# Patient Record
Sex: Male | Born: 1960
Health system: Southern US, Community
[De-identification: ages and names within clinical notes are randomized; demographics above are authoritative.]

## PROBLEM LIST (undated history)

## (undated) DIAGNOSIS — G44009 Cluster headache syndrome, unspecified, not intractable: Secondary | ICD-10-CM

## (undated) DIAGNOSIS — I10 Essential (primary) hypertension: Secondary | ICD-10-CM

## (undated) HISTORY — PX: CARPAL TUNNEL RELEASE: SHX101

## (undated) HISTORY — DX: Cluster headache syndrome, unspecified, not intractable: G44.009

## (undated) HISTORY — PX: COLONOSCOPY: SHX174

## (undated) HISTORY — DX: Essential (primary) hypertension: I10

---

## 2006-03-05 ENCOUNTER — Ambulatory Visit (HOSPITAL_COMMUNITY): Admission: RE | Admit: 2006-03-05 | Discharge: 2006-03-05 | Payer: Self-pay | Admitting: Unknown Physician Specialty

## 2010-11-01 ENCOUNTER — Ambulatory Visit (INDEPENDENT_AMBULATORY_CARE_PROVIDER_SITE_OTHER): Payer: BC Managed Care – PPO | Admitting: Cardiovascular Disease

## 2010-11-01 DIAGNOSIS — R9431 Abnormal electrocardiogram [ECG] [EKG]: Secondary | ICD-10-CM

## 2010-11-01 DIAGNOSIS — E7800 Pure hypercholesterolemia, unspecified: Secondary | ICD-10-CM

## 2010-11-01 DIAGNOSIS — F17200 Nicotine dependence, unspecified, uncomplicated: Secondary | ICD-10-CM

## 2010-11-01 DIAGNOSIS — I10 Essential (primary) hypertension: Secondary | ICD-10-CM

## 2010-11-08 ENCOUNTER — Telehealth (INDEPENDENT_AMBULATORY_CARE_PROVIDER_SITE_OTHER): Payer: Self-pay | Admitting: *Deleted

## 2010-11-13 NOTE — Progress Notes (Signed)
----   Converted from flag ---- ---- 11/07/2010 3:30 PM, Marilynne Halsted, CMA, AAMA wrote: Victorino Dike,  For Stress echo 11-09-10@[facility], pt has BCBS of [place], Per Marion T, [id number], no precert required.  [place],  Can you correct pt's policy#?  Per Merdis Delay T, it is supposed to be: [id number]  thanks,  Charmaine ------------------------------

## 2010-11-15 ENCOUNTER — Telehealth: Payer: Self-pay | Admitting: Cardiovascular Disease

## 2010-11-22 NOTE — Progress Notes (Signed)
Summary: stress test results  Phone Note Outgoing Call   Call placed by: Dessie Coma  LPN,  November 15, 2010 5:46 PM Call placed to: Patient Summary of Call: Spoke to daughter in patient's absence-informed her that stress test was normal per Dr. Kirke Corin.

## 2010-12-07 NOTE — Letter (Signed)
November 01, 2010   Dr. Brent Bulla [address] [place], [place] [address]  RE:  Jeff, Acevedo MRN:  [id number]  /  DOB:  [birth year]  Dear Dr. Marina Goodell,  Thank you for referring [name] for further cardiac evaluation.  As you are aware, this is a pleasant 50 year old gentleman with the following problem list: 1. Prolonged history of hypertension which was diagnosed more than 20     years ago. 2. Hypertension. 3. History of cluster headache.  HISTORY OF PRESENT ILLNESS:  [name] is here today for evaluation of abnormal ECG.  He is not aware of any previous cardiac history. However, he is aware that his EKG has been abnormal for awhile.  He denies any chest pain, dyspnea, palpitations, presyncope or syncope. There is no orthopnea, PND or lower extremity edema.  Overall, he does not seem to have cardiac symptoms.  He is very active at work.  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  He is usually not symptomatic with any of these activities.  According to his wife, he does have problems at night when he is asleep.  She notices episodes of apnea followed by loud noise.  During the day, when he is home, he is usually tired and takes naps.  He has not been evaluated for sleep apnea.  MEDICATIONS: 1. Hydrochlorothiazide 25 mg half a tablet once daily. 2. Lisinopril 40 mg once daily. 3. Crestor 40 mg half a tablet at bedtime. 4. Verapamil sustained release 360 mg once daily.  ALLERGIES:  NO KNOWN DRUG ALLERGIES.  SOCIAL HISTORY:  This is remarkable for smoking one pack per day for 21 years.  He denies any alcohol or recreational drug use.  He is married and has two children.  PAST SURGICAL HISTORY:  Carpal tunnel surgery.  FAMILY HISTORY:  Father died at age of congestive heart failure.  He had coronary artery disease in his early 39s.  There is a strong family history of hypertension.  REVIEW OF SYSTEMS:  Remarkable for occasional  heartburn.  A full review of system was performed and is otherwise negative.  PHYSICAL EXAMINATION:  GENERAL:  The patient appears to be his stated age and in no acute distress. VITAL SIGNS:  Weight is 186.4 pounds, blood pressure is 139/89, pulse is 77, oxygen saturation is 97% on room air. HEENT:  Normocephalic, atraumatic. NECK:  No JVD or carotid bruits. RESPIRATORY:  Normal respiratory effort with no use of accessory muscles.  Auscultation reveals normal breath sounds. CARDIOVASCULAR:  Normal PMI.  Normal S1-S2 with no gallops or murmurs. ABDOMEN:  Benign, nontender, nondistended. EXTREMITIES:  With no clubbing, cyanosis or edema. SKIN:  Warm and dry with no rash. PSYCHIATRIC:  He is alert, oriented x3 with normal mood and affect. MUSCULOSKELETAL:  There is normal muscle strength in the upper and lower extremities.  STUDIES:  An electrocardiogram was performed which showed normal sinus rhythm.  There is borderline criteria for left ventricular hypertrophy. There are T-wave changes mostly in the anterolateral and lateral leads suggestive of ischemia.  IMPRESSION: 1. Abnormal ECG which could be due to hypertensive heart disease.     There are also some possible ischemic changes.  The patient does     not have any symptoms suggestive of angina, heart failure or     arrhythmia.  However, the EKG is significantly abnormal and the     patient has multiple risk factors for coronary artery disease.  Thus, I recommend proceeding with a stress echocardiogram for     further evaluation.  This will give [place] an idea about the structure     of his heart as well as ruling out an underlying obstructive     coronary artery disease.  In the meanwhile, I recommend continuing     treatment of his a hypertension.  He would probably benefit from     aspirin 81 mg once daily which was discussed with him.  Continued     aggressive lifestyle changes are recommended. 2. Hypertension.  This is  likely essential hypertension, and he had     this for a long time, likely due to his family history.  I will     continue with current medications.  He does have symptoms that are     strongly suggestive of sleep apnea.  I discussed with him that this     will have long-term effect on his blood pressure as well as his     cardiac status.  Thus, I recommend that he gets evaluation and     treatment for possible sleep apnea.  He will discuss this with Dr.     Marina Goodell upon follow-up. 3. Tobacco use.  The patient was counseled about the importance of     smoking cessation.  He does not seem to be ready mentally yet to     commit. 4. Hyperlipidemia.  Being treated with Crestor.  Goal LDL should be     less than 100.  The patient will be notified with the results of     the stress test.  He will follow-up if it is abnormal.  Thank you for allowing me to participate in the care of your patient.   Sincerely,     Lorine Bears, MD Electronically Signed   MA/MedQ  DD: 11/01/2010  DT: 11/01/2010  Job #: [id number]

## 2011-05-09 ENCOUNTER — Encounter: Payer: Self-pay | Admitting: Cardiovascular Disease

## 2011-05-30 ENCOUNTER — Encounter: Payer: Self-pay | Admitting: Cardiovascular Disease

## 2016-01-01 ENCOUNTER — Encounter (HOSPITAL_COMMUNITY): Payer: Self-pay | Admitting: Emergency Medicine

## 2016-01-01 ENCOUNTER — Emergency Department (HOSPITAL_COMMUNITY)
Admission: EM | Admit: 2016-01-01 | Discharge: 2016-01-02 | Disposition: A | Discharge status: Home / Self Care | Payer: BLUE CROSS/BLUE SHIELD | Source: Home / Self Care | Attending: Emergency Medicine | Admitting: Emergency Medicine

## 2016-01-01 DIAGNOSIS — Z8669 Personal history of other diseases of the nervous system and sense organs: Secondary | ICD-10-CM | POA: Insufficient documentation

## 2016-01-01 DIAGNOSIS — R630 Anorexia: Secondary | ICD-10-CM | POA: Insufficient documentation

## 2016-01-01 DIAGNOSIS — R509 Fever, unspecified: Secondary | ICD-10-CM | POA: Insufficient documentation

## 2016-01-01 DIAGNOSIS — R634 Abnormal weight loss: Secondary | ICD-10-CM | POA: Insufficient documentation

## 2016-01-01 DIAGNOSIS — I10 Essential (primary) hypertension: Secondary | ICD-10-CM | POA: Insufficient documentation

## 2016-01-01 DIAGNOSIS — R05 Cough: Secondary | ICD-10-CM | POA: Insufficient documentation

## 2016-01-01 DIAGNOSIS — Z79899 Other long term (current) drug therapy: Secondary | ICD-10-CM | POA: Insufficient documentation

## 2016-01-01 DIAGNOSIS — R51 Headache: Secondary | ICD-10-CM

## 2016-01-01 DIAGNOSIS — A879 Viral meningitis, unspecified: Secondary | ICD-10-CM | POA: Diagnosis not present

## 2016-01-01 DIAGNOSIS — R197 Diarrhea, unspecified: Secondary | ICD-10-CM

## 2016-01-01 DIAGNOSIS — R112 Nausea with vomiting, unspecified: Secondary | ICD-10-CM | POA: Insufficient documentation

## 2016-01-01 LAB — COMPREHENSIVE METABOLIC PANEL
ALBUMIN: 3 g/dL — AB (ref 3.5–5.0)
ALT: 14 U/L — AB (ref 17–63)
AST: 23 U/L (ref 15–41)
Alkaline Phosphatase: 63 U/L (ref 38–126)
Anion gap: 8 (ref 5–15)
BUN: 21 mg/dL — AB (ref 6–20)
CHLORIDE: 100 mmol/L — AB (ref 101–111)
CO2: 26 mmol/L (ref 22–32)
CREATININE: 0.87 mg/dL (ref 0.61–1.24)
Calcium: 8.2 mg/dL — ABNORMAL LOW (ref 8.9–10.3)
GFR calc Af Amer: >60 mL/min (ref >60)
Glucose, Bld: 119 mg/dL — ABNORMAL HIGH (ref 65–99)
POTASSIUM: 3.6 mmol/L (ref 3.5–5.1)
SODIUM: 134 mmol/L — AB (ref 135–145)
Total Bilirubin: 0.3 mg/dL (ref 0.3–1.2)
Total Protein: 6.8 g/dL (ref 6.5–8.1)

## 2016-01-01 LAB — CBC
HEMATOCRIT: 40.8 % (ref 39.0–52.0)
Hemoglobin: 14 g/dL (ref 13.0–17.0)
MCH: 29.5 pg (ref 26.0–34.0)
MCHC: 34.3 g/dL (ref 30.0–36.0)
MCV: 85.9 fL (ref 78.0–100.0)
Platelets: 223 10*3/uL (ref 150–400)
RBC: 4.75 MIL/uL (ref 4.22–5.81)
RDW: 12.9 % (ref 11.5–15.5)
WBC: 8.4 10*3/uL (ref 4.0–10.5)

## 2016-01-01 LAB — LIPASE, BLOOD: LIPASE: 21 U/L (ref 11–51)

## 2016-01-01 NOTE — ED Notes (Signed)
Pt states that he has had fever, headache, and emesis x 1 week. Tested for flu twice but negative. Alert and oriented.

## 2016-01-02 ENCOUNTER — Emergency Department (HOSPITAL_COMMUNITY): Payer: BLUE CROSS/BLUE SHIELD

## 2016-01-02 LAB — DIFFERENTIAL
BASOS ABS: 0 10*3/uL (ref 0.0–0.1)
BASOS PCT: 0 %
EOS ABS: 0 10*3/uL (ref 0.0–0.7)
EOS PCT: 0 %
Lymphocytes Relative: 17 %
Lymphs Abs: 1.4 10*3/uL (ref 0.7–4.0)
Monocytes Absolute: 0.6 10*3/uL (ref 0.1–1.0)
Monocytes Relative: 8 %
NEUTROS PCT: 75 %
Neutro Abs: 6.4 10*3/uL (ref 1.7–7.7)

## 2016-01-02 LAB — URINALYSIS, ROUTINE W REFLEX MICROSCOPIC
BILIRUBIN URINE: NEGATIVE
Glucose, UA: NEGATIVE mg/dL
Hgb urine dipstick: NEGATIVE
KETONES UR: NEGATIVE mg/dL
LEUKOCYTES UA: NEGATIVE
NITRITE: NEGATIVE
PH: 6 (ref 5.0–8.0)
PROTEIN: NEGATIVE mg/dL
SPECIFIC GRAVITY, URINE: 1.025 (ref 1.005–1.030)

## 2016-01-02 MED ORDER — SODIUM CHLORIDE 0.9 % IV SOLN
1000.0000 mL | Freq: Once | INTRAVENOUS | Status: AC
  Administered 2016-01-02: 1000 mL via INTRAVENOUS

## 2016-01-02 MED ORDER — DOXYCYCLINE HYCLATE 100 MG PO CAPS: 100.0000 mg | ORAL_CAPSULE | Freq: Two times a day (BID) | ORAL | Status: DC

## 2016-01-02 MED ORDER — SODIUM CHLORIDE 0.9 % IV SOLN
1000.0000 mL | INTRAVENOUS | Status: DC
  Administered 2016-01-02: 1000 mL via INTRAVENOUS

## 2016-01-02 MED ORDER — DOXYCYCLINE HYCLATE 100 MG PO TABS
100.0000 mg | ORAL_TABLET | Freq: Once | ORAL | Status: AC
  Administered 2016-01-02: 100 mg via ORAL
  Filled 2016-01-02: qty 1

## 2016-01-02 NOTE — Discharge Instructions (Signed)
Evaluation so far has not shown the reason for your fever. Please keep your follow-up appointment with your primary care provider. Return to the emergency department if symptoms worsen.   Fever, Adult A fever is an increase in the body's temperature. It is usually defined as a temperature of 100F (38C) or higher. Brief mild or moderate fevers generally have no long-term effects, and they often do not require treatment. Moderate or high fevers may make you feel uncomfortable and can sometimes be a sign of a serious illness or disease. The sweating that may occur with repeated or prolonged fever may also cause dehydration. Fever is confirmed by taking a temperature with a thermometer. A measured temperature can vary with:  Age.  Time of day.  Location of the thermometer:  Mouth (oral).  Rectum (rectal).  Ear (tympanic).  Underarm (axillary).  Forehead (temporal). HOME CARE INSTRUCTIONS Pay attention to any changes in your symptoms. Take these actions to help with your condition:  Take over-the counter and prescription medicines only as told by your health care provider. Follow the dosing instructions carefully.  If you were prescribed an antibiotic medicine, take it as told by your health care provider. Do not stop taking the antibiotic even if you start to feel better.  Rest as needed.  Drink enough fluid to keep your urine clear or pale yellow. This helps to prevent dehydration.  Sponge yourself or bathe with room-temperature water to help reduce your body temperature as needed. Do not use ice water.  Do not overbundle yourself in blankets or heavy clothes. SEEK MEDICAL CARE IF:  You vomit.  You cannot eat or drink without vomiting.  You have diarrhea.  You have pain when you urinate.  Your symptoms do not improve with treatment.  You develop new symptoms.  You develop excessive weakness. SEEK IMMEDIATE MEDICAL CARE IF:  You have shortness of breath or have  trouble breathing.  You are dizzy or you faint.  You are disoriented or confused.  You develop signs of dehydration, such as a dry mouth, decreased urination, or paleness.  You develop severe pain in your abdomen.  You have persistent vomiting or diarrhea.  You develop a skin rash.  Your symptoms suddenly get worse.   This information is not intended to replace advice given to you by your health care provider. Make sure you discuss any questions you have with your health care provider.   Document Released: 02/26/2001 Document Revised: 05/24/2015 Document Reviewed: 10/27/2014 Elsevier Interactive Patient Education 2016 [employer].  Doxycycline tablets or capsules What is this medicine? DOXYCYCLINE (dox i SYE kleen) is a tetracycline antibiotic. It kills certain bacteria or stops their growth. It is used to treat many kinds of infections, like dental, skin, respiratory, and urinary tract infections. It also treats acne, Lyme disease, malaria, and certain sexually transmitted infections. This medicine may be used for other purposes; ask your health care provider or pharmacist if you have questions. What should I tell my health care provider before I take this medicine? They need to know if you have any of these conditions: -liver disease -long exposure to sunlight like working outdoors -stomach problems like colitis -an unusual or allergic reaction to doxycycline, tetracycline antibiotics, other medicines, foods, dyes, or preservatives -pregnant or trying to get pregnant -breast-feeding How should I use this medicine? Take this medicine by mouth with a full glass of water. Follow the directions on the prescription label. It is best to take this medicine without food, but  if it upsets your stomach take it with food. Take your medicine at regular intervals. Do not take your medicine more often than directed. Take all of your medicine as directed even if you think you are better. Do  not skip doses or stop your medicine early. Talk to your pediatrician regarding the use of this medicine in children. While this drug may be prescribed for selected conditions, precautions do apply. Overdosage: If you think you have taken too much of this medicine contact a poison control center or emergency room at once. NOTE: This medicine is only for you. Do not share this medicine with others. What if I miss a dose? If you miss a dose, take it as soon as you can. If it is almost time for your next dose, take only that dose. Do not take double or extra doses. What may interact with this medicine? -antacids -barbiturates -birth control pills -bismuth subsalicylate -carbamazepine -methoxyflurane -other antibiotics -phenytoin -vitamins that contain iron -warfarin This list may not describe all possible interactions. Give your health care provider a list of all the medicines, herbs, non-prescription drugs, or dietary supplements you use. Also tell them if you smoke, drink alcohol, or use illegal drugs. Some items may interact with your medicine. What should I watch for while using this medicine? Tell your doctor or health care professional if your symptoms do not improve. Do not treat diarrhea with over the counter products. Contact your doctor if you have diarrhea that lasts more than 2 days or if it is severe and watery. Do not take this medicine just before going to bed. It may not dissolve properly when you lay down and can cause pain in your throat. Drink plenty of fluids while taking this medicine to also help reduce irritation in your throat. This medicine can make you more sensitive to the sun. Keep out of the sun. If you cannot avoid being in the sun, wear protective clothing and use sunscreen. Do not use sun lamps or tanning beds/booths. Birth control pills may not work properly while you are taking this medicine. Talk to your doctor about using an extra method of birth control. If  you are being treated for a sexually transmitted infection, avoid sexual contact until you have finished your treatment. Your sexual partner may also need treatment. Avoid antacids, aluminum, calcium, magnesium, and iron products for 4 hours before and 2 hours after taking a dose of this medicine. If you are using this medicine to prevent malaria, you should still protect yourself from contact with mosquitos. Stay in screened-in areas, use mosquito nets, keep your body covered, and use an insect repellent. What side effects may I notice from receiving this medicine? Side effects that you should report to your doctor or health care professional as soon as possible: -allergic reactions like skin rash, itching or hives, swelling of the face, lips, or tongue -difficulty breathing -fever -itching in the rectal or genital area -pain on swallowing -redness, blistering, peeling or loosening of the skin, including inside the mouth -severe stomach pain or cramps -unusual bleeding or bruising -unusually weak or tired -yellowing of the eyes or skin Side effects that usually do not require medical attention (report to your doctor or health care professional if they continue or are bothersome): -diarrhea -loss of appetite -nausea, vomiting This list may not describe all possible side effects. Call your doctor for medical advice about side effects. You may report side effects to FDA at 1-800-FDA-1088. Where should I keep my  medicine? Keep out of the reach of children. Store at room temperature, below 30 degrees C (86 degrees F). Protect from light. Keep container tightly closed. Throw away any unused medicine after the expiration date. Taking this medicine after the expiration date can make you seriously ill. NOTE: This sheet is a summary. It may not cover all possible information. If you have questions about this medicine, talk to your doctor, pharmacist, or health care provider.    2016, Elsevier/Gold  Standard. (2014-12-23 12:10:28)

## 2016-01-02 NOTE — ED Provider Notes (Signed)
CSN: [id number]     Arrival date & time 01/01/16  1926 History  By signing my name below, I, Tanda Rockers, attest that this documentation has been prepared under the direction and in the presence of Dione Booze, MD. Electronically Signed: Tanda Rockers, ED Scribe. 01/02/2016. 12:12 AM.   Chief Complaint  Patient presents with  . Fever  . Emesis   The history is provided by the patient. No language interpreter was used.     HPI Comments: Jeff Acevedo is a 55 y.o. male with PMHx HTN, who presents to the Emergency Department complaining of fever with Tmax 104 x 1 week. Pt's temperature in the ED is 98.6. Pt also complains of headaches, chills, loss of appetite, nausea, vomiting, diarrhea, and dry cough. Family reports that pt has lost 10 pounds in the past 2 weeks. He was seen at [facility] earlier today and was tested for flu which was negative. Pt is unsure if he has had recent sick contact. No recent tick exposure. Denies rhinorrhea, sore throat, rash, or any other associated symptoms.  Past Medical History  Diagnosis Date  . Hypotension   . HTN (hypertension)   . Cluster headache    Past Surgical History  Procedure Laterality Date  . Carpal tunnel release     Family History  Problem Relation Age of Onset  . Heart failure     Social History  Substance Use Topics  . Smoking status: Current Every Day Smoker -- 1.00 packs/day for 21 years  . Smokeless tobacco: None  . Alcohol Use: No    Review of Systems  Constitutional: Positive for fever, chills, appetite change and unexpected weight change.  HENT: Negative for rhinorrhea and sore throat.   Respiratory: Positive for cough.   Gastrointestinal: Positive for nausea, vomiting and diarrhea.  Skin: Negative for rash.  Neurological: Positive for headaches.  All other systems reviewed and are negative.  Allergies  Review of patient's allergies indicates no known allergies.  Home Medications   Prior to Admission medications    Medication Sig Start Date End Date Taking? Authorizing Provider  hydrochlorothiazide 25 MG tablet Take 25 mg by mouth daily.     Yes Historical Provider, MD  lisinopril (PRINIVIL,ZESTRIL) 40 MG tablet Take 40 mg by mouth daily.     Yes Historical Provider, MD  rosuvastatin (CRESTOR) 40 MG tablet Take 40 mg by mouth daily.    Yes Historical Provider, MD  verapamil (CALAN-SR) 180 MG CR tablet Take 360 mg by mouth at bedtime.   Yes Historical Provider, MD   BP 146/89 mmHg  Pulse 62  Temp(Src) 98.6 F (37 C) (Oral)  Resp 18  SpO2 100%   Physical Exam  Constitutional: He is oriented to person, place, and time. He appears well-developed and well-nourished. No distress.  HENT:  Head: Normocephalic and atraumatic.  Eyes: Conjunctivae and EOM are normal. Pupils are equal, round, and reactive to light.  Neck: Normal range of motion. Neck supple. No JVD present.  Cardiovascular: Normal rate, regular rhythm and normal heart sounds.   Pulmonary/Chest: Effort normal and breath sounds normal. He has no wheezes. He has no rales. He exhibits no tenderness.  Abdominal: Soft. Bowel sounds are normal. He exhibits no distension and no mass. There is no tenderness.  Musculoskeletal: Normal range of motion. He exhibits no edema.  Lymphadenopathy:    He has no cervical adenopathy.  Neurological: He is alert and oriented to person, place, and time. No cranial nerve deficit. He exhibits  normal muscle tone. Coordination normal.  Skin: Skin is warm and dry. No rash noted.  Psychiatric: He has a normal mood and affect. His behavior is normal. Judgment and thought content normal.  Nursing note and vitals reviewed.   ED Course  Procedures (including critical care time)  DIAGNOSTIC STUDIES: Oxygen Saturation is 100% on RA, normal by my interpretation.    COORDINATION OF CARE: 12:10 AM-Discussed treatment plan which includes IV Fluids, UA, and CXR with pt at bedside and pt agreed to plan.   Labs  Review Results for orders placed or performed during the hospital encounter of 01/01/16  Lipase, blood  Result Value Ref Range   Lipase 21 11 - 51 U/L  Comprehensive metabolic panel  Result Value Ref Range   Sodium 134 (L) 135 - 145 mmol/L   Potassium 3.6 3.5 - 5.1 mmol/L   Chloride 100 (L) 101 - 111 mmol/L   CO2 26 22 - 32 mmol/L   Glucose, Bld 119 (H) 65 - 99 mg/dL   BUN 21 (H) 6 - 20 mg/dL   Creatinine, Ser 1.61 0.61 - 1.24 mg/dL   Calcium 8.2 (L) 8.9 - 10.3 mg/dL   Total Protein 6.8 6.5 - 8.1 g/dL   Albumin 3.0 (L) 3.5 - 5.0 g/dL   AST 23 15 - 41 U/L   ALT 14 (L) 17 - 63 U/L   Alkaline Phosphatase 63 38 - 126 U/L   Total Bilirubin 0.3 0.3 - 1.2 mg/dL   GFR calc non Af Amer >60 >60 mL/min   GFR calc Af Amer >60 >60 mL/min   Anion gap 8 5 - 15  CBC  Result Value Ref Range   WBC 8.4 4.0 - 10.5 K/uL   RBC 4.75 4.22 - 5.81 MIL/uL   Hemoglobin 14.0 13.0 - 17.0 g/dL   HCT 09.6 04.5 - 40.9 %   MCV 85.9 78.0 - 100.0 fL   MCH 29.5 26.0 - 34.0 pg   MCHC 34.3 30.0 - 36.0 g/dL   RDW 81.1 91.4 - 78.2 %   Platelets 223 150 - 400 K/uL  Urinalysis, Routine w reflex microscopic (not at [facility])  Result Value Ref Range   Color, Urine YELLOW YELLOW   APPearance CLEAR CLEAR   Specific Gravity, Urine 1.025 1.005 - 1.030   pH 6.0 5.0 - 8.0   Glucose, UA NEGATIVE NEGATIVE mg/dL   Hgb urine dipstick NEGATIVE NEGATIVE   Bilirubin Urine NEGATIVE NEGATIVE   Ketones, ur NEGATIVE NEGATIVE mg/dL   Protein, ur NEGATIVE NEGATIVE mg/dL   Nitrite NEGATIVE NEGATIVE   Leukocytes, UA NEGATIVE NEGATIVE  Differential  Result Value Ref Range   Neutrophils Relative % 75 %   Neutro Abs 6.4 1.7 - 7.7 K/uL   Lymphocytes Relative 17 %   Lymphs Abs 1.4 0.7 - 4.0 K/uL   Monocytes Relative 8 %   Monocytes Absolute 0.6 0.1 - 1.0 K/uL   Eosinophils Relative 0 %   Eosinophils Absolute 0.0 0.0 - 0.7 K/uL   Basophils Relative 0 %   Basophils Absolute 0.0 0.0 - 0.1 K/uL   Dg Chest 2 View  01/02/2016   CLINICAL DATA:  Fever, headache, and vomiting for 1 week. History of hypertension. Current smoker. EXAM: CHEST  2 VIEW COMPARISON:  None. FINDINGS: Normal heart size and pulmonary vascularity. No focal airspace disease or consolidation in the lungs. No blunting of costophrenic angles. No pneumothorax. Mediastinal contours appear intact. Degenerative changes in the spine. Calcification of the aorta. IMPRESSION: No  active cardiopulmonary disease. Electronically Signed   By: Burman NievesWilliam  Stevens M.D.   On: 01/02/2016 00:36     Imaging Review Dg Chest 2 View  01/02/2016  CLINICAL DATA:  Fever, headache, and vomiting for 1 week. History of hypertension. Current smoker. EXAM: CHEST  2 VIEW COMPARISON:  None. FINDINGS: Normal heart size and pulmonary vascularity. No focal airspace disease or consolidation in the lungs. No blunting of costophrenic angles. No pneumothorax. Mediastinal contours appear intact. Degenerative changes in the spine. Calcification of the aorta. IMPRESSION: No active cardiopulmonary disease. Electronically Signed   By: Burman NievesWilliam  Stevens M.D.   On: 01/02/2016 00:36   I have personally reviewed and evaluated these images and lab results as part of my medical decision-making.    MDM   Final diagnoses:  Fever, unspecified fever cause    Fever of uncertain cause. Exam is unremarkable and history is unremarkable. He has had negative flu test. In the ED, chest x-ray shows no evidence of pneumonia, urinalysis is normal, WBC is normal. He was given IV fluids with some subjective improvement. Decision is made to treat for possible occult tick borne illness such as [facility] spotted fever or line disease. He is discharged with prescription for doxycycline and is referred back to his PCP for follow-up.   I personally performed the services described in this documentation, which was scribed in my presence. The recorded information has been reviewed and is accurate.       Dione Boozeavid Audry Kauzlarich,  MD 01/02/16 0830

## 2016-01-03 ENCOUNTER — Encounter (HOSPITAL_COMMUNITY): Payer: Self-pay | Admitting: *Deleted

## 2016-01-03 ENCOUNTER — Emergency Department (HOSPITAL_COMMUNITY): Payer: BLUE CROSS/BLUE SHIELD

## 2016-01-03 ENCOUNTER — Inpatient Hospital Stay (HOSPITAL_COMMUNITY)
Admission: EM | Admit: 2016-01-03 | Discharge: 2016-01-05 | DRG: 075 | Disposition: A | Discharge status: Home / Self Care | Payer: BLUE CROSS/BLUE SHIELD | Attending: Internal Medicine | Admitting: Internal Medicine

## 2016-01-03 DIAGNOSIS — Z8249 Family history of ischemic heart disease and other diseases of the circulatory system: Secondary | ICD-10-CM | POA: Diagnosis not present

## 2016-01-03 DIAGNOSIS — R519 Headache, unspecified: Secondary | ICD-10-CM

## 2016-01-03 DIAGNOSIS — A879 Viral meningitis, unspecified: Secondary | ICD-10-CM | POA: Diagnosis present

## 2016-01-03 DIAGNOSIS — Z79899 Other long term (current) drug therapy: Secondary | ICD-10-CM

## 2016-01-03 DIAGNOSIS — G44009 Cluster headache syndrome, unspecified, not intractable: Secondary | ICD-10-CM | POA: Diagnosis present

## 2016-01-03 DIAGNOSIS — E785 Hyperlipidemia, unspecified: Secondary | ICD-10-CM | POA: Diagnosis present

## 2016-01-03 DIAGNOSIS — G039 Meningitis, unspecified: Secondary | ICD-10-CM | POA: Diagnosis not present

## 2016-01-03 DIAGNOSIS — D72829 Elevated white blood cell count, unspecified: Secondary | ICD-10-CM | POA: Diagnosis not present

## 2016-01-03 DIAGNOSIS — F17210 Nicotine dependence, cigarettes, uncomplicated: Secondary | ICD-10-CM | POA: Diagnosis present

## 2016-01-03 DIAGNOSIS — R188 Other ascites: Secondary | ICD-10-CM | POA: Diagnosis present

## 2016-01-03 DIAGNOSIS — R51 Headache: Secondary | ICD-10-CM

## 2016-01-03 DIAGNOSIS — I10 Essential (primary) hypertension: Secondary | ICD-10-CM | POA: Diagnosis present

## 2016-01-03 LAB — CSF CELL COUNT WITH DIFFERENTIAL
Eosinophils, CSF: 0 % (ref 0–1)
Eosinophils, CSF: 0 % (ref 0–1)
LYMPHS CSF: 66 % (ref 40–80)
Lymphs, CSF: 57 % (ref 40–80)
MONOCYTE-MACROPHAGE-SPINAL FLUID: 9 % — AB (ref 15–45)
Monocyte-Macrophage-Spinal Fluid: 6 % — ABNORMAL LOW (ref 15–45)
RBC COUNT CSF: 3 /mm3 — AB
RBC Count, CSF: 1 /mm3 — ABNORMAL HIGH
SEGMENTED NEUTROPHILS-CSF: 28 % — AB (ref 0–6)
SEGMENTED NEUTROPHILS-CSF: 34 % — AB (ref 0–6)
Tube #: 1
Tube #: 4
WBC CSF: 370 /mm3 — AB (ref 0–5)
WBC CSF: 396 /mm3 — AB (ref 0–5)

## 2016-01-03 LAB — COMPREHENSIVE METABOLIC PANEL
ALK PHOS: 72 U/L (ref 38–126)
ALT: 14 U/L — AB (ref 17–63)
AST: 27 U/L (ref 15–41)
Albumin: 3.1 g/dL — ABNORMAL LOW (ref 3.5–5.0)
Anion gap: 11 (ref 5–15)
BUN: 20 mg/dL (ref 6–20)
CALCIUM: 8.2 mg/dL — AB (ref 8.9–10.3)
CHLORIDE: 102 mmol/L (ref 101–111)
CO2: 25 mmol/L (ref 22–32)
CREATININE: 0.87 mg/dL (ref 0.61–1.24)
Glucose, Bld: 80 mg/dL (ref 65–99)
Potassium: 3.4 mmol/L — ABNORMAL LOW (ref 3.5–5.1)
Sodium: 138 mmol/L (ref 135–145)
Total Bilirubin: 0.6 mg/dL (ref 0.3–1.2)
Total Protein: 7 g/dL (ref 6.5–8.1)

## 2016-01-03 LAB — CBC WITH DIFFERENTIAL/PLATELET
BASOS PCT: 0 %
Basophils Absolute: 0 10*3/uL (ref 0.0–0.1)
EOS ABS: 0 10*3/uL (ref 0.0–0.7)
EOS PCT: 0 %
HCT: 41.9 % (ref 39.0–52.0)
Hemoglobin: 14.2 g/dL (ref 13.0–17.0)
LYMPHS ABS: 2 10*3/uL (ref 0.7–4.0)
Lymphocytes Relative: 26 %
MCH: 29.2 pg (ref 26.0–34.0)
MCHC: 33.9 g/dL (ref 30.0–36.0)
MCV: 86 fL (ref 78.0–100.0)
MONOS PCT: 10 %
Monocytes Absolute: 0.8 10*3/uL (ref 0.1–1.0)
NEUTROS PCT: 64 %
Neutro Abs: 4.9 10*3/uL (ref 1.7–7.7)
PLATELETS: 208 10*3/uL (ref 150–400)
RBC: 4.87 MIL/uL (ref 4.22–5.81)
RDW: 12.7 % (ref 11.5–15.5)
WBC: 7.6 10*3/uL (ref 4.0–10.5)

## 2016-01-03 LAB — URINALYSIS, ROUTINE W REFLEX MICROSCOPIC
Bilirubin Urine: NEGATIVE
GLUCOSE, UA: NEGATIVE mg/dL
HGB URINE DIPSTICK: NEGATIVE
Ketones, ur: NEGATIVE mg/dL
LEUKOCYTES UA: NEGATIVE
Nitrite: NEGATIVE
PROTEIN: NEGATIVE mg/dL
pH: 7.5 (ref 5.0–8.0)

## 2016-01-03 LAB — PROTEIN, CSF: Total  Protein, CSF: 81 mg/dL — ABNORMAL HIGH (ref 15–45)

## 2016-01-03 LAB — I-STAT CG4 LACTIC ACID, ED: LACTIC ACID, VENOUS: 0.62 mmol/L (ref 0.5–2.0)

## 2016-01-03 LAB — GLUCOSE, CSF: GLUCOSE CSF: 36 mg/dL — AB (ref 40–70)

## 2016-01-03 MED ORDER — VANCOMYCIN HCL 10 G IV SOLR
1500.0000 mg | INTRAVENOUS | Status: AC
Start: 2016-01-03 — End: 2016-01-04
  Administered 2016-01-03: 1500 mg via INTRAVENOUS
  Filled 2016-01-03: qty 1500

## 2016-01-03 MED ORDER — ENOXAPARIN SODIUM 40 MG/0.4ML ~~LOC~~ SOLN
40.0000 mg | SUBCUTANEOUS | Status: DC
  Administered 2016-01-03 – 2016-01-04 (×2): 40 mg via SUBCUTANEOUS
  Filled 2016-01-03 (×2): qty 0.4

## 2016-01-03 MED ORDER — DEXAMETHASONE SODIUM PHOSPHATE 4 MG/ML IJ SOLN: 0.1500 mg/kg | Freq: Four times a day (QID) | INTRAMUSCULAR | Status: DC

## 2016-01-03 MED ORDER — SODIUM CHLORIDE 0.9 % IV BOLUS (SEPSIS)
1000.0000 mL | Freq: Once | INTRAVENOUS | Status: AC
  Administered 2016-01-03: 1000 mL via INTRAVENOUS

## 2016-01-03 MED ORDER — LISINOPRIL 20 MG PO TABS
40.0000 mg | ORAL_TABLET | Freq: Every day | ORAL | Status: DC
  Administered 2016-01-03 – 2016-01-05 (×3): 40 mg via ORAL
  Filled 2016-01-03: qty 1
  Filled 2016-01-03 (×2): qty 2

## 2016-01-03 MED ORDER — DEXTROSE 5 % IV SOLN
10.0000 mg/kg | Freq: Three times a day (TID) | INTRAVENOUS | Status: DC
  Administered 2016-01-04: 800 mg via INTRAVENOUS
  Filled 2016-01-03 (×2): qty 16

## 2016-01-03 MED ORDER — ROSUVASTATIN CALCIUM 10 MG PO TABS
40.0000 mg | ORAL_TABLET | Freq: Every day | ORAL | Status: DC
  Administered 2016-01-03: 40 mg via ORAL
  Administered 2016-01-04: 20 mg via ORAL
  Administered 2016-01-05: 40 mg via ORAL
  Filled 2016-01-03: qty 4
  Filled 2016-01-03: qty 1
  Filled 2016-01-03: qty 4

## 2016-01-03 MED ORDER — ACETAMINOPHEN 325 MG PO TABS: 650.0000 mg | ORAL_TABLET | Freq: Four times a day (QID) | ORAL | Status: DC | PRN

## 2016-01-03 MED ORDER — SODIUM CHLORIDE 0.9 % IV SOLN
2.0000 g | INTRAVENOUS | Status: DC
  Administered 2016-01-03 – 2016-01-04 (×4): 2 g via INTRAVENOUS
  Filled 2016-01-03 (×7): qty 2000

## 2016-01-03 MED ORDER — HYDROCHLOROTHIAZIDE 25 MG PO TABS
25.0000 mg | ORAL_TABLET | Freq: Every day | ORAL | Status: DC
  Filled 2016-01-03: qty 1

## 2016-01-03 MED ORDER — VANCOMYCIN HCL IN DEXTROSE 1-5 GM/200ML-% IV SOLN
1000.0000 mg | Freq: Three times a day (TID) | INTRAVENOUS | Status: DC
  Administered 2016-01-04: 1000 mg via INTRAVENOUS
  Filled 2016-01-03: qty 200

## 2016-01-03 MED ORDER — VERAPAMIL HCL ER 180 MG PO TBCR
360.0000 mg | EXTENDED_RELEASE_TABLET | Freq: Every day | ORAL | Status: DC
  Administered 2016-01-04: 360 mg via ORAL
  Filled 2016-01-03 (×2): qty 2

## 2016-01-03 MED ORDER — DEXTROSE 5 % IV SOLN
2.0000 g | Freq: Once | INTRAVENOUS | Status: AC
  Administered 2016-01-03: 2 g via INTRAVENOUS
  Filled 2016-01-03: qty 2

## 2016-01-03 MED ORDER — IOPAMIDOL (ISOVUE-300) INJECTION 61%
100.0000 mL | Freq: Once | INTRAVENOUS | Status: AC | PRN
  Administered 2016-01-03: 100 mL via INTRAVENOUS

## 2016-01-03 MED ORDER — DEXTROSE 5 % IV SOLN
2.0000 g | Freq: Two times a day (BID) | INTRAVENOUS | Status: DC
  Administered 2016-01-04: 2 g via INTRAVENOUS
  Filled 2016-01-03 (×2): qty 2

## 2016-01-03 MED ORDER — DEXTROSE 5 % IV SOLN
10.0000 mg/kg | INTRAVENOUS | Status: AC
  Administered 2016-01-03: 800 mg via INTRAVENOUS
  Filled 2016-01-03: qty 16

## 2016-01-03 MED ORDER — HYDROMORPHONE HCL 1 MG/ML IJ SOLN
1.0000 mg | Freq: Once | INTRAMUSCULAR | Status: AC
  Administered 2016-01-03: 1 mg via INTRAVENOUS
  Filled 2016-01-03: qty 1

## 2016-01-03 MED ORDER — ONDANSETRON HCL 4 MG/2ML IJ SOLN
4.0000 mg | Freq: Once | INTRAMUSCULAR | Status: DC
  Filled 2016-01-03: qty 2

## 2016-01-03 MED ORDER — DEXAMETHASONE SODIUM PHOSPHATE 10 MG/ML IJ SOLN
10.0000 mg | Freq: Four times a day (QID) | INTRAMUSCULAR | Status: DC
  Administered 2016-01-03 – 2016-01-04 (×3): 10 mg via INTRAVENOUS
  Filled 2016-01-03 (×3): qty 1

## 2016-01-03 NOTE — H&P (Signed)
[facility] History and Physical  Jeff Acevedo ZOX:[id number] DOB: 11-11-1960 DOA: 01/03/2016  Referring physician: EDP PCP: Abigail Miyamoto, MD   Chief Complaint: Headache, fever   HPI: Jeff Acevedo is a 55 y.o. male who presents to the ED with headache and fever.  He states he has a 3 week history of weight loss, 2 week history of night sweats, and 1 week history of intermittent headache and fever as high as 104.  Symptoms have been improved with ibuprofen.  Yesterday came to ED and was started on doxycycline for possible tick borne illness.  Patient does have a dog that goes outside but denies any specific tick bites.  Hasnt started the doxycycline yet, returns to ED for worsening symptoms.  Review of Systems: Systems reviewed.  As above, otherwise negative  Past Medical History  Diagnosis Date  . Hypotension   . HTN (hypertension)   . Cluster headache    Past Surgical History  Procedure Laterality Date  . Carpal tunnel release     Social History:  reports that he has been smoking.  He does not have any smokeless tobacco history on file. He reports that he does not drink alcohol or use illicit drugs.  No Known Allergies  Family History  Problem Relation Age of Onset  . Heart failure       Prior to Admission medications   Medication Sig Start Date End Date Taking? Authorizing Provider  hydrochlorothiazide 25 MG tablet Take 25 mg by mouth daily.     Yes Historical Provider, MD  lisinopril (PRINIVIL,ZESTRIL) 40 MG tablet Take 40 mg by mouth daily.     Yes Historical Provider, MD  rosuvastatin (CRESTOR) 40 MG tablet Take 40 mg by mouth daily.    Yes Historical Provider, MD  verapamil (CALAN-SR) 180 MG CR tablet Take 360 mg by mouth at bedtime.   Yes Historical Provider, MD   Physical Exam: Filed Vitals:   01/03/16 2000 01/03/16 2054  BP: 164/80 151/80  Pulse: 51 53  Temp:  100.2 F (37.9 C)  Resp: 17 18    BP 151/80 mmHg  Pulse 53  Temp(Src)  100.2 F (37.9 C) (Oral)  Resp 18  Ht 6' (1.829 m)  Wt 79.833 kg (176 lb)  BMI 23.86 kg/m2  SpO2 98%  General Appearance:    Alert, oriented, no distress, appears stated age  Head:    Normocephalic, atraumatic  Eyes:    PERRL, EOMI, sclera non-icteric        Nose:   Nares without drainage or epistaxis. Mucosa, turbinates normal  Throat:   Moist mucous membranes. Oropharynx without erythema or exudate.  Neck:   Supple. No carotid bruits.  No thyromegaly.  No lymphadenopathy.   Back:     No CVA tenderness, no spinal tenderness  Lungs:     Clear to auscultation bilaterally, without wheezes, rhonchi or rales  Chest wall:    No tenderness to palpitation  Heart:    Regular rate and rhythm without murmurs, gallops, rubs  Abdomen:     Soft, non-tender, nondistended, normal bowel sounds, no organomegaly  Genitalia:    deferred  Rectal:    deferred  Extremities:   No clubbing, cyanosis or edema.  Pulses:   2+ and symmetric all extremities  Skin:   Skin color, texture, turgor normal, no rashes or lesions  Lymph nodes:   Cervical, supraclavicular, and axillary nodes normal  Neurologic:   CNII-XII intact. Normal strength, sensation and reflexes  throughout    Labs on Admission:  Basic Metabolic Panel:  Recent Labs Lab 01/01/16 1955 01/03/16 1625  NA 134* 138  K 3.6 3.4*  CL 100* 102  CO2 26 25  GLUCOSE 119* 80  BUN 21* 20  CREATININE 0.87 0.87  CALCIUM 8.2* 8.2*   Liver Function Tests:  Recent Labs Lab 01/01/16 1955 01/03/16 1625  AST 23 27  ALT 14* 14*  ALKPHOS 63 72  BILITOT 0.3 0.6  PROT 6.8 7.0  ALBUMIN 3.0* 3.1*    Recent Labs Lab 01/01/16 1955  LIPASE 21   No results for input(s): AMMONIA in the last 168 hours. CBC:  Recent Labs Lab 01/01/16 1955 01/03/16 1625  WBC 8.4 7.6  NEUTROABS 6.4 4.9  HGB 14.0 14.2  HCT 40.8 41.9  MCV 85.9 86.0  PLT 223 208   Cardiac Enzymes: No results for input(s): CKTOTAL, CKMB, CKMBINDEX, TROPONINI in the last  168 hours.  BNP (last 3 results) No results for input(s): PROBNP in the last 8760 hours. CBG: No results for input(s): GLUCAP in the last 168 hours.  Radiological Exams on Admission: Dg Chest 2 View  01/02/2016  CLINICAL DATA:  Fever, headache, and vomiting for 1 week. History of hypertension. Current smoker. EXAM: CHEST  2 VIEW COMPARISON:  None. FINDINGS: Normal heart size and pulmonary vascularity. No focal airspace disease or consolidation in the lungs. No blunting of costophrenic angles. No pneumothorax. Mediastinal contours appear intact. Degenerative changes in the spine. Calcification of the aorta. IMPRESSION: No active cardiopulmonary disease. Electronically Signed   By: Burman Nieves M.D.   On: 01/02/2016 00:36   Ct Head Wo Contrast  01/03/2016  CLINICAL DATA:  Headache.  Rule out meningitis.  Low-grade fever. EXAM: CT HEAD WITHOUT CONTRAST TECHNIQUE: Contiguous axial images were obtained from the base of the skull through the vertex without intravenous contrast. COMPARISON:  None. FINDINGS: Ventricle size is normal. Negative for acute or chronic infarction. Negative for hemorrhage or fluid collection. Negative for mass or edema. No shift of the midline structures. Calvarium is intact. IMPRESSION: Normal Electronically Signed   By: Marlan Palau M.D.   On: 01/03/2016 15:48   Ct Abdomen Pelvis W Contrast  01/03/2016  CLINICAL DATA:  Headache and fever. Nausea. Loss of appetite. Vomiting and diarrhea. Weight loss. EXAM: CT ABDOMEN AND PELVIS WITH CONTRAST TECHNIQUE: Multidetector CT imaging of the abdomen and pelvis was performed using the standard protocol following bolus administration of intravenous contrast. CONTRAST:  ISOVUE-300 IOPAMIDOL (ISOVUE-300) INJECTION 61% COMPARISON:  11/28/2008 ultrasound FINDINGS: Despite efforts by the technologist and patient, motion artifact is present on today's exam and could not be eliminated. This reduces exam sensitivity and specificity.  Lower chest:  Unremarkable Hepatobiliary: Hypodense 5 mm lesion in segment 4 of the liver, image 18/2, technically nonspecific due to small size, statistically likely to be a cyst. Borderline gallbladder wall thickening. Pancreas: Unremarkable Spleen: Unremarkable Adrenals/Urinary Tract: Unremarkable Stomach/Bowel: Unremarkable.  Appendix normal. Vascular/Lymphatic: Aortoiliac atherosclerotic vascular disease. No pathologic adenopathy identified. Reproductive: Unremarkable Other: Small but abnormal amount of pelvic ascites, image 64/2. Low-level mesenteric edema. Faint stranding in the right omentum. Musculoskeletal: Mild spurring of both femoral heads. Bridging spurring of the right sacroiliac joint. IMPRESSION: 1. Small but abnormal amount of pelvic ascites with low-level mesenteric edema and mild stranding in the right upper quadrant omentum. There is some mild gallbladder wall thickening. Consider gallbladder sonography for further characterization in assessing for cholecystitis. 2. 5 mm lesion in segment 4 of the  liver is probably a cyst but technically too small to characterize. 3.  Aortoiliac atherosclerotic vascular disease. Electronically Signed   By: Gaylyn RongWalter  Liebkemann M.D.   On: 01/03/2016 16:12    EKG: Independently reviewed.  Assessment/Plan Principal Problem:   Meningitis Active Problems:   HTN (hypertension), benign   1. Meningitis - 1. Spoke with Dr. Ninetta LightsHatcher: 2. CSF studies including GS, culture, cryptococcal antigen, HSV PCR 3. HIV screen ordered 4. Rocephin, vanc, acyclovir, and decadron ordered for empiric coverage 5. Tylenol PRN fever 2. HTN - continue home meds 3. DVT ppx - lovenox    Code Status: Full  Family Communication: Family at bedside Disposition Plan: Admit to inpatient   Time spent: 70 min  Nea Gittens M. [facility] Pager [phone]  If 7AM-7PM, please contact the day team taking care of the patient [email or web address] Password [email or web address] 01/03/2016, 9:25  PM

## 2016-01-03 NOTE — ED Notes (Signed)
Patient not in room- went to CT

## 2016-01-03 NOTE — ED Notes (Signed)
Report given to Kenton KingfisherMeredith Mills, RN. Patient transported to floor.

## 2016-01-03 NOTE — ED Provider Notes (Signed)
CSN: [id number]     Arrival date & time 01/03/16  1333 History   First MD Initiated Contact with Patient 01/03/16 1352     Chief Complaint  Patient presents with  . Headache  . Fever   PT SAID THAT HE HAS HAD FEVER AND N/V FOR THE PAST FEW DAYS.  HE HAS LOST ALMOST 20 LBS.  THE PT HAS BEEN TO URGENT CARE AND TO THIS ED 2 DAYS AGO.  THE PT WENT TO HIS PCP TODAY WHO SENT HIM IN FOR A LUMBAR PUNCTURE.  PT C/O HIS BP.  (Consider location/radiation/quality/duration/timing/severity/associated sxs/prior Treatment) Patient is a 55 y.o. male presenting with headaches and fever. The history is provided by the patient.  Headache Pain location:  Generalized Quality:  Sharp Radiates to:  Does not radiate Severity currently:  5/10 Onset quality:  Gradual Timing:  Constant Associated symptoms: fever and nausea   Fever Associated symptoms: headaches and nausea     Past Medical History  Diagnosis Date  . Hypotension   . HTN (hypertension)   . Cluster headache    Past Surgical History  Procedure Laterality Date  . Carpal tunnel release     Family History  Problem Relation Age of Onset  . Heart failure     Social History  Substance Use Topics  . Smoking status: Current Every Day Smoker -- 1.00 packs/day for 21 years  . Smokeless tobacco: None  . Alcohol Use: No    Review of Systems  Constitutional: Positive for fever.  Gastrointestinal: Positive for nausea.  Neurological: Positive for headaches.  All other systems reviewed and are negative.     Allergies  Review of patient's allergies indicates no known allergies.  Home Medications   Prior to Admission medications   Medication Sig Start Date End Date Taking? Authorizing Provider  hydrochlorothiazide 25 MG tablet Take 25 mg by mouth daily.     Yes Historical Provider, MD  lisinopril (PRINIVIL,ZESTRIL) 40 MG tablet Take 40 mg by mouth daily.     Yes Historical Provider, MD  rosuvastatin (CRESTOR) 40 MG tablet Take 40 mg by  mouth daily.    Yes Historical Provider, MD  verapamil (CALAN-SR) 180 MG CR tablet Take 360 mg by mouth at bedtime.   Yes Historical Provider, MD  doxycycline (VIBRAMYCIN) 100 MG capsule Take 1 capsule (100 mg total) by mouth 2 (two) times daily. 01/02/16   Dione Boozeavid Glick, MD   BP 145/86 mmHg  Pulse 57  Temp(Src) 98.4 F (36.9 C) (Oral)  Resp 18  SpO2 97% Physical Exam  Constitutional: He is oriented to person, place, and time. He appears well-developed and well-nourished.  HENT:  Head: Normocephalic and atraumatic.  Eyes: Conjunctivae are normal. Pupils are equal, round, and reactive to light.  Neck: Normal range of motion. Neck supple.  Cardiovascular: Normal rate, regular rhythm and normal heart sounds.   Pulmonary/Chest: Effort normal and breath sounds normal.  Abdominal: Soft. Bowel sounds are normal.  Musculoskeletal: Normal range of motion.  Neurological: He is alert and oriented to person, place, and time.  Skin: Skin is warm and dry.  Psychiatric: He has a normal mood and affect.    ED Course  Procedures (including critical care time) Labs Review Labs Reviewed  CULTURE, BLOOD (ROUTINE X 2)  CULTURE, BLOOD (ROUTINE X 2)  URINALYSIS, ROUTINE W REFLEX MICROSCOPIC (NOT AT [facility])  COMPREHENSIVE METABOLIC PANEL  CBC WITH DIFFERENTIAL/PLATELET    Imaging Review Dg Chest 2 View  01/02/2016  CLINICAL DATA:  Fever, headache, and vomiting for 1 week. History of hypertension. Current smoker. EXAM: CHEST  2 VIEW COMPARISON:  None. FINDINGS: Normal heart size and pulmonary vascularity. No focal airspace disease or consolidation in the lungs. No blunting of costophrenic angles. No pneumothorax. Mediastinal contours appear intact. Degenerative changes in the spine. Calcification of the aorta. IMPRESSION: No active cardiopulmonary disease. Electronically Signed   By: Burman Nieves M.D.   On: 01/02/2016 00:36   I have personally reviewed and evaluated these images and lab results as part  of my medical decision-making.   EKG Interpretation None      MDM  I ATTEMPTED AN LP, HOWEVER WAS UNSUCCESSFUL.  I ASKED FOR IR TO DO A LP UNDER FLUORO.  LABS, CT OF HEAD AND ABD ARE PENDING.  LP AND RESULTS ARE PENDING.  PT SIGNED OUT TO DR. Radford Pax. Final diagnoses:  None   FEVER BY HISTORY POSSIBLE MENINGITIS HEADACHE NAUSEA AND VOMITING    Jacalyn Lefevre, MD 01/05/16 [id number]

## 2016-01-03 NOTE — ED Notes (Signed)
Called to give report, receiving RN will call back

## 2016-01-03 NOTE — Progress Notes (Addendum)
Pharmacy Antibiotic Note  Jeff Acevedo is a 55 y.o. male admitted on 01/03/2016 with headache and fever. He has been to Urgent Care and [facility] on 4/17. He saw his PCP today 4/19 who sent him back to the ED for an LP. Pharmacy is now consulted to dose vancomycin, ceftriaxone and acyclovir.  Plan: - Vancomycin 1500mg  IV x 1, then 1g IV q8h. Goal trough 15-20 mcg/ml. - Ceftriaxone 2g IV q12h - Acyclovir 800mg  (10mg /kg) IV q8h  Per Dr. Ninetta LightsHatcher, add ampicillin 2g q4h for Listeria coverage since age > 50 years. Also add dexamethasone 10mg  IV q6h to be given 30 minutes prior to antibiotics for 4 days per meningitis focused order set.  Height: 6' (182.9 cm) Weight: 176 lb (79.833 kg) IBW/kg (Calculated) : 77.6  Temp (24hrs), Avg:99.3 F (37.4 C), Min:98.4 F (36.9 C), Max:100.2 F (37.9 C)   Recent Labs Lab 01/01/16 1955 01/03/16 1625 01/03/16 2030  WBC 8.4 7.6  --   CREATININE 0.87 0.87  --   LATICACIDVEN  --   --  0.62    Estimated Creatinine Clearance: 104.1 mL/min (by C-G formula based on Cr of 0.87).    No Known Allergies  Antimicrobials this admission: 4/19 >> Vancomycin >> 4/19 >> Ceftriaxone >> 4/19 >> Acyclovir >> 4/19 >> Ampicillin >>  Dose adjustments this admission: ---  Microbiology results: 4/19 BCx: sent 4/19 CSF: sent  Thank you for allowing pharmacy to be a part of this patient's care.  Loralee PacasErin Danasha Melman, PharmD, BCPS Pager: [phone] 01/03/2016 9:07 PM

## 2016-01-03 NOTE — ED Notes (Addendum)
Pt resting comfortably with family at bedside.  Denies any complaints at this time.  Pt wants to sit up.  Reminded him that he cannot sit up  4 hour after LP.  He reports he returned around 1815

## 2016-01-03 NOTE — ED Notes (Signed)
Lab delay - RN and EDP in with patient doing procedure.

## 2016-01-03 NOTE — ED Notes (Signed)
Pt reports he was sent to the ED from his PCP today to r/o Meningitis d/t h/a, and low grade fever.  Pt also reports nausea.  Pt reports he has been having h/a, n/v, and fever for over a week now.  Was seen 2 days ago for same in the ED.

## 2016-01-04 DIAGNOSIS — G039 Meningitis, unspecified: Secondary | ICD-10-CM

## 2016-01-04 DIAGNOSIS — D72829 Elevated white blood cell count, unspecified: Secondary | ICD-10-CM

## 2016-01-04 DIAGNOSIS — I10 Essential (primary) hypertension: Secondary | ICD-10-CM

## 2016-01-04 LAB — BASIC METABOLIC PANEL
ANION GAP: 9 (ref 5–15)
BUN: 15 mg/dL (ref 6–20)
CO2: 23 mmol/L (ref 22–32)
Calcium: 7.9 mg/dL — ABNORMAL LOW (ref 8.9–10.3)
Chloride: 106 mmol/L (ref 101–111)
Creatinine, Ser: 0.83 mg/dL (ref 0.61–1.24)
GFR calc Af Amer: >60 mL/min (ref >60)
GFR calc non Af Amer: >60 mL/min (ref >60)
GLUCOSE: 115 mg/dL — AB (ref 65–99)
POTASSIUM: 3.7 mmol/L (ref 3.5–5.1)
Sodium: 138 mmol/L (ref 135–145)

## 2016-01-04 LAB — CBC
HEMATOCRIT: 39.3 % (ref 39.0–52.0)
Hemoglobin: 13.2 g/dL (ref 13.0–17.0)
MCH: 28.8 pg (ref 26.0–34.0)
MCHC: 33.6 g/dL (ref 30.0–36.0)
MCV: 85.8 fL (ref 78.0–100.0)
Platelets: 198 10*3/uL (ref 150–400)
RBC: 4.58 MIL/uL (ref 4.22–5.81)
RDW: 12.8 % (ref 11.5–15.5)
WBC: 7.9 10*3/uL (ref 4.0–10.5)

## 2016-01-04 LAB — PATHOLOGIST SMEAR REVIEW

## 2016-01-04 MED ORDER — ENSURE ENLIVE PO LIQD
237.0000 mL | Freq: Two times a day (BID) | ORAL | Status: DC
  Administered 2016-01-05: 237 mL via ORAL

## 2016-01-04 MED ORDER — CETYLPYRIDINIUM CHLORIDE 0.05 % MT LIQD
7.0000 mL | Freq: Two times a day (BID) | OROMUCOSAL | Status: DC
  Administered 2016-01-04 – 2016-01-05 (×2): 7 mL via OROMUCOSAL

## 2016-01-04 NOTE — Progress Notes (Signed)
Initial Nutrition Assessment  DOCUMENTATION CODES:   Not applicable  INTERVENTION:  -Ensure Enlive po BID, each supplement provides 350 kcal and 20 grams of protein -RD continue to monitor  NUTRITION DIAGNOSIS:   Inadequate oral intake related to poor appetite, acute illness as evidenced by per patient/family report.  GOAL:   Patient will meet greater than or equal to 90% of their needs  MONITOR:   PO intake, Labs, I & O's, Supplement acceptance, Skin  REASON FOR ASSESSMENT:   Malnutrition Screening Tool    ASSESSMENT:   Jeff Acevedo is a 55 y.o. male who presents to the ED with headache and fever. He states he has a 3 week history of weight loss, 2 week history of night sweats, and 1 week history of intermittent headache and fever as high as 104  Spoke with Jeff Acevedo at bedside. He presents with likely viral meningitis; overall malaise. Endorses approximately 15# wt loss in 3 weeks concurrent with poor appetite. Wt loss likely r/t night sweats, general dehydration though he claims that he ate small meals and drank fluids during this time span. No weight hx in chart.  PO intake is improving -> 100% meal consumption today per chart. Pt had tray sitting in his room during time of visit he had completed.  Nutrition-Focused physical exam completed. Findings are no fat depletion, mild muscle depletion, and no edema.   Labs: Ca 7.9 Medications reviewed.  Diet Order:  Diet Heart Room service appropriate?: Yes; Fluid consistency:: Thin  Skin:  Reviewed, no issues  Last BM:  4/18  Height:   Ht Readings from Last 1 Encounters:  01/03/16 6' (1.829 m)    Weight:   Wt Readings from Last 1 Encounters:  01/03/16 175 lb 14.4 oz (79.788 kg)    Ideal Body Weight:  80.9 kg  BMI:  Body mass index is 23.85 kg/(m^2).  Estimated Nutritional Needs:   Kcal:  2000-2400 calories  Protein:  80-90 grams  Fluid:  >/= 2L  EDUCATION NEEDS:   No education needs  identified at this time  Dionne AnoWilliam M. Milca Sytsma, MS, RD LDN After Hours/Weekend Pager [phone]

## 2016-01-04 NOTE — Progress Notes (Addendum)
PROGRESS NOTE    Jeff Acevedo  ZOX:[id number] DOB: 1961/06/01 DOA: 01/03/2016 PCP: Abigail Miyamoto, MD  Outpatient Specialists:  Brief Narrative: 54/M with HTN, admitted with headaches and fevers x1 week LP c/w meningitis, on empiric Abx-improving  Assessment & Plan:    Meningitis -suspect Viral -on Empiric Ceftriaxone/Vanc/Ampicillin/Acyclovir -CSF gram stain negative -clinically improving -HIv pending, Crypto Ag negative -HSV pending -ID consulted on admission -also on empiric Dexamethasone, ? Stop this   Abnormal CT findings with mild GB wall thickening and scant pelvic ascites -no symptoms at all, will need repeat imaging in few months    HTN (hypertension), benign -stable, continue lisinopril  DVT prophylaxis: lovenox Code Status:Full Code Family Communication: Wife at bedside Disposition Plan: Home in 1-2days pending culture data  Consultants:   ID   Procedures: LP 4/19  Antimicrobials: Vanc/Ceftriaxone/Ampicillin  Subjective: Feels much better  Objective: Filed Vitals:   01/03/16 2204 01/03/16 2215 01/03/16 2245 01/04/16 0554  BP: 123/71  131/75 114/87  Pulse: 64 57 55 62  Temp:   98.7 F (37.1 C) 98.3 F (36.8 C)  TempSrc:   Oral Oral  Resp: 16 19 16 16   Height:   6' (1.829 m)   Weight:   79.788 kg (175 lb 14.4 oz)   SpO2: 99% 95% 100% 98%    Intake/Output Summary (Last 24 hours) at 01/04/16 1016 Last data filed at 01/04/16 0624  Gross per 24 hour  Intake   2725 ml  Output      0 ml  Net   2725 ml   Filed Weights   01/03/16 2100 01/03/16 2245  Weight: 79.833 kg (176 lb) 79.788 kg (175 lb 14.4 oz)    Examination:  General exam: Appears calm and comfortable  Respiratory system: Clear to auscultation. Respiratory effort normal. Cardiovascular system: S1 & S2 heard, RRR. No JVD, murmurs, rubs, gallops or clicks. No pedal edema. Gastrointestinal system: Abdomen is nondistended, soft and nontender. No organomegaly or masses  felt. Normal bowel sounds heard. Central nervous system: Alert and oriented. No focal neurological deficits. Extremities: Symmetric 5 x 5 power. Skin: No rashes, lesions or ulcers Psychiatry: Judgement and insight appear normal. Mood & affect appropriate.     Data Reviewed: I have personally reviewed following labs and imaging studies  CBC:  Recent Labs Lab 01/01/16 1955 01/03/16 1625 01/04/16 0351  WBC 8.4 7.6 7.9  NEUTROABS 6.4 4.9  --   HGB 14.0 14.2 13.2  HCT 40.8 41.9 39.3  MCV 85.9 86.0 85.8  PLT 223 208 198   Basic Metabolic Panel:  Recent Labs Lab 01/01/16 1955 01/03/16 1625 01/04/16 0351  NA 134* 138 138  K 3.6 3.4* 3.7  CL 100* 102 106  CO2 26 25 23   GLUCOSE 119* 80 115*  BUN 21* 20 15  CREATININE 0.87 0.87 0.83  CALCIUM 8.2* 8.2* 7.9*   GFR: Estimated Creatinine Clearance: 109.1 mL/min (by C-G formula based on Cr of 0.83). Liver Function Tests:  Recent Labs Lab 01/01/16 1955 01/03/16 1625  AST 23 27  ALT 14* 14*  ALKPHOS 63 72  BILITOT 0.3 0.6  PROT 6.8 7.0  ALBUMIN 3.0* 3.1*    Recent Labs Lab 01/01/16 1955  LIPASE 21   No results for input(s): AMMONIA in the last 168 hours. Coagulation Profile: No results for input(s): INR, PROTIME in the last 168 hours. Cardiac Enzymes: No results for input(s): CKTOTAL, CKMB, CKMBINDEX, TROPONINI in the last 168 hours. BNP (last 3 results) No results for input(s):  PROBNP in the last 8760 hours. HbA1C: No results for input(s): HGBA1C in the last 72 hours. CBG: No results for input(s): GLUCAP in the last 168 hours. Lipid Profile: No results for input(s): CHOL, HDL, LDLCALC, TRIG, CHOLHDL, LDLDIRECT in the last 72 hours. Thyroid Function Tests: No results for input(s): TSH, T4TOTAL, FREET4, T3FREE, THYROIDAB in the last 72 hours. Anemia Panel: No results for input(s): VITAMINB12, FOLATE, FERRITIN, TIBC, IRON, RETICCTPCT in the last 72 hours. Urine analysis:    Component Value Date/Time    COLORURINE YELLOW 01/03/2016 1707   APPEARANCEUR CLEAR 01/03/2016 1707   LABSPEC >1.046* 01/03/2016 1707   PHURINE 7.5 01/03/2016 1707   GLUCOSEU NEGATIVE 01/03/2016 1707   HGBUR NEGATIVE 01/03/2016 1707   BILIRUBINUR NEGATIVE 01/03/2016 1707   KETONESUR NEGATIVE 01/03/2016 1707   PROTEINUR NEGATIVE 01/03/2016 1707   NITRITE NEGATIVE 01/03/2016 1707   LEUKOCYTESUR NEGATIVE 01/03/2016 1707   Sepsis Labs: (procalcitonin:4,lacticidven:4)  ) Recent Results (from the past 240 hour(s))  Culture, blood (routine x 2)     Status: None (Preliminary result)   Collection Time: 01/02/16 12:15 AM  Result Value Ref Range Status   Specimen Description BLOOD RIGHT ANTECUBITAL  Final   Special Requests BOTTLES DRAWN AEROBIC AND ANAEROBIC 5CC  Final   Culture   Final    NO GROWTH 1 DAY Performed at [facility]    Report Status PENDING  Incomplete  Culture, blood (routine x 2)     Status: None (Preliminary result)   Collection Time: 01/02/16 12:21 AM  Result Value Ref Range Status   Specimen Description BLOOD BLOOD RIGHT FOREARM  Final   Special Requests BOTTLES DRAWN AEROBIC AND ANAEROBIC 10CC  Final   Culture   Final    NO GROWTH 1 DAY Performed at [facility]    Report Status PENDING  Incomplete  Culture, blood (routine x 2)     Status: None (Preliminary result)   Collection Time: 01/03/16  4:20 PM  Result Value Ref Range Status   Specimen Description BLOOD LEFT ANTECUBITAL  Final   Special Requests   Final    BOTTLES DRAWN AEROBIC AND ANAEROBIC 5 CC Performed at [facility]    Culture PENDING  Incomplete   Report Status PENDING  Incomplete  Culture, blood (routine x 2)     Status: None (Preliminary result)   Collection Time: 01/03/16  4:25 PM  Result Value Ref Range Status   Specimen Description BLOOD BLOOD LEFT FOREARM  Final   Special Requests   Final    BOTTLES DRAWN AEROBIC AND ANAEROBIC 5 CC Performed at [facility]    Culture  PENDING  Incomplete   Report Status PENDING  Incomplete  CSF culture     Status: None (Preliminary result)   Collection Time: 01/03/16  6:07 PM  Result Value Ref Range Status   Specimen Description CSF  Final   Special Requests Normal  Final   Gram Stain   Final    CYTOSPIN SMEAR WBC PRESENT,BOTH PMN AND MONONUCLEAR NO ORGANISMS SEEN Gram Stain Report Called to,Read Back By and Verified With: Vira Browns RN 4.19.17 @ 1924 BY RICEJ    Culture PENDING  Incomplete   Report Status PENDING  Incomplete         Radiology Studies: Ct Head Wo Contrast  01/03/2016  CLINICAL DATA:  Headache.  Rule out meningitis.  Low-grade fever. EXAM: CT HEAD WITHOUT CONTRAST TECHNIQUE: Contiguous axial images were obtained from the base of the  skull through the vertex without intravenous contrast. COMPARISON:  None. FINDINGS: Ventricle size is normal. Negative for acute or chronic infarction. Negative for hemorrhage or fluid collection. Negative for mass or edema. No shift of the midline structures. Calvarium is intact. IMPRESSION: Normal Electronically Signed   By: Marlan Palauharles  Clark M.D.   On: 01/03/2016 15:48   Ct Abdomen Pelvis W Contrast  01/03/2016  CLINICAL DATA:  Headache and fever. Nausea. Loss of appetite. Vomiting and diarrhea. Weight loss. EXAM: CT ABDOMEN AND PELVIS WITH CONTRAST TECHNIQUE: Multidetector CT imaging of the abdomen and pelvis was performed using the standard protocol following bolus administration of intravenous contrast. CONTRAST:  100mL ISOVUE-300 IOPAMIDOL (ISOVUE-300) INJECTION 61% COMPARISON:  11/28/2008 ultrasound FINDINGS: Despite efforts by the technologist and patient, motion artifact is present on today's exam and could not be eliminated. This reduces exam sensitivity and specificity. Lower chest:  Unremarkable Hepatobiliary: Hypodense 5 mm lesion in segment 4 of the liver, image 18/2, technically nonspecific due to small size, statistically likely to be a cyst. Borderline  gallbladder wall thickening. Pancreas: Unremarkable Spleen: Unremarkable Adrenals/Urinary Tract: Unremarkable Stomach/Bowel: Unremarkable.  Appendix normal. Vascular/Lymphatic: Aortoiliac atherosclerotic vascular disease. No pathologic adenopathy identified. Reproductive: Unremarkable Other: Small but abnormal amount of pelvic ascites, image 64/2. Low-level mesenteric edema. Faint stranding in the right omentum. Musculoskeletal: Mild spurring of both femoral heads. Bridging spurring of the right sacroiliac joint. IMPRESSION: 1. Small but abnormal amount of pelvic ascites with low-level mesenteric edema and mild stranding in the right upper quadrant omentum. There is some mild gallbladder wall thickening. Consider gallbladder sonography for further characterization in assessing for cholecystitis. 2. 5 mm lesion in segment 4 of the liver is probably a cyst but technically too small to characterize. 3.  Aortoiliac atherosclerotic vascular disease. Electronically Signed   By: Gaylyn RongWalter  Liebkemann M.D.   On: 01/03/2016 16:12   Dg Lumbar Puncture Fluoro Guide  01/04/2016  CLINICAL DATA:  Meningitis.  Headache. EXAM: DIAGNOSTIC LUMBAR PUNCTURE UNDER FLUOROSCOPIC GUIDANCE FLUOROSCOPY TIME:  Radiation Exposure Index (as provided by the fluoroscopic device): 20.9 If the device does not provide the exposure index: Fluoroscopy Time (in minutes and seconds):  43 Number of Acquired Images:  0 PROCEDURE: Informed consent was obtained from the patient prior to the procedure, including potential complications of headache, allergy, and pain. With the patient prone, the lower back was prepped with Betadine. 1% Lidocaine was used for local anesthesia. Lumbar puncture was performed at the L3-4 level using a 22 gauge needle with return of clear CSF with an opening pressure of 19 cm water. 10.5 ml of CSF were obtained for laboratory studies. The patient tolerated the procedure well and there were no apparent complications. IMPRESSION: 1.  Technically successful lumbar puncture. At this time pathology is pending. Electronically Signed   By: Signa Kellaylor  Stroud M.D.   On: 01/04/2016 08:22        Scheduled Meds: . acyclovir  10 mg/kg Intravenous Q8H  . ampicillin (OMNIPEN) IV  2 g Intravenous Q4H  . antiseptic oral rinse  7 mL Mouth Rinse BID  . cefTRIAXone (ROCEPHIN)  IV  2 g Intravenous Q12H  . dexamethasone  10 mg Intravenous Q6H  . enoxaparin (LOVENOX) injection  40 mg Subcutaneous Q24H  . lisinopril  40 mg Oral Daily  . ondansetron (ZOFRAN) IV  4 mg Intravenous Once  . rosuvastatin  40 mg Oral Daily  . vancomycin  1,000 mg Intravenous Q8H  . verapamil  360 mg Oral QHS   Continuous Infusions:  LOS: 1 day    Time spent:    Zannie Cove, MD [facility] Pager [phone]  If 7PM-7AM, please contact night-coverage [email or web address] Password [email or web address] 01/04/2016, 10:16 AM

## 2016-01-04 NOTE — Consult Note (Addendum)
[facility]       Reason for Consult: meningitis    Referring Physician: Dr. Jomarie Longs  Principal Problem:   Meningitis Active Problems:   HTN (hypertension), benign   . antiseptic oral rinse  7 mL Mouth Rinse BID  . enoxaparin (LOVENOX) injection  40 mg Subcutaneous Q24H  . lisinopril  40 mg Oral Daily  . ondansetron (ZOFRAN) IV  4 mg Intravenous Once  . rosuvastatin  40 mg Oral Daily  . verapamil  360 mg Oral QHS    Recommendations: Stop antibiotics Stop steroids Supportive care I will add on enterovirus Watch culture overnight and if remains negative, ok from ID standpoint for d/c   Assessment: He has meningitis with elevated WBCs, lymphocytic predominance, some mild elevation in protein and glucose of 36 in CSF but this has been ongoing for 2 weeks, no meningismus on exam either by me or earlier and patient looks well. Not c/w RMSF with prolonged course either and no risk factors.  Therefore I do not suspect a bacterial origin and he just needs supportive care.  No confusion or AMS/encephalopathy.  I would like to watch the culture one more day but if remains negative, ok for d/c tomorrow from ID standpoint.   Much of his improvement may be steroids so he may have some headache returning off of steroids.    Dr. Orvan Falconer is available tomorrow if needed  Antibiotics: Vancomycin, ceftriaxone, ampicillin, acyclovir, steroids  HPI: Jeff Acevedo is a 55 y.o. male with hsistory of HTN, hyperlipidemia, cluster headaches who notes a 2 week history of headache, fever up to 104, malaise.  Over the last week also with poor po intake. Symptoms relieved with ibuprofen but would come back once it wore off.  [place], no tick exposures or wooded exposure, no unpasteurized foods/dairy, no travel.  Was seen in the ED the previous night and given doxycycline but did not fill.  Has not had neck pain, no sick contacts, no children but his wife is a  [employer] loss adjuster.  CT head ok. Has remained afebrile here.    Review of Systems:  Constitutional: negative for fatigue and malaise Cardiovascular: negative for chest pain Gastrointestinal: negative for nausea and diarrhea All other systems reviewed and are negative   Past Medical History  Diagnosis Date  . Hypotension   . HTN (hypertension)   . Cluster headache     Social History  Substance Use Topics  . Smoking status: Current Every Day Smoker -- 1.00 packs/day for 21 years  . Smokeless tobacco: None  . Alcohol Use: No    Family History  Problem Relation Age of Onset  . Heart failure      No Known Allergies  Physical Exam: Constitutional: in no apparent distress and alert  Filed Vitals:   01/04/16 0554 01/04/16 1402  BP: 114/87 134/81  Pulse: 62 61  Temp: 98.3 F (36.8 C) 98.1 F (36.7 C)  Resp: 16 14   EYES: anicteric ENMT: Cardiovascular: Cor RRR and No murmurs Respiratory: CTA B; normal respiratory effort GI: Bowel sounds are normal, liver is not enlarged, spleen is not enlarged Musculoskeletal: no pedal edema noted Skin: negatives: no rash Neuro: no meningismus, alert,   Lab Results  Component Value Date   WBC 7.9 01/04/2016   HGB 13.2 01/04/2016   HCT 39.3 01/04/2016   MCV 85.8 01/04/2016   PLT 198 01/04/2016    Lab Results  Component Value Date  CREATININE 0.83 01/04/2016   BUN 15 01/04/2016   NA 138 01/04/2016   K 3.7 01/04/2016   CL 106 01/04/2016   CO2 23 01/04/2016    Lab Results  Component Value Date   ALT 14* 01/03/2016   AST 27 01/03/2016   ALKPHOS 72 01/03/2016     Microbiology: Recent Results (from the past 240 hour(s))  Culture, blood (routine x 2)     Status: None (Preliminary result)   Collection Time: 01/02/16 12:15 AM  Result Value Ref Range Status   Specimen Description BLOOD RIGHT ANTECUBITAL  Final   Special Requests BOTTLES DRAWN AEROBIC AND ANAEROBIC 5CC  Final   Culture   Final    NO GROWTH 1 DAY [facility]    Report Status PENDING  Incomplete  Culture, blood (routine x 2)     Status: None (Preliminary result)   Collection Time: 01/02/16 12:21 AM  Result Value Ref Range Status   Specimen Description BLOOD BLOOD RIGHT FOREARM  Final   Special Requests BOTTLES DRAWN AEROBIC AND ANAEROBIC 10CC  Final   Culture   Final    NO GROWTH 1 DAY [facility]    Report Status PENDING  Incomplete  Culture, blood (routine x 2)     Status: None (Preliminary result)   Collection Time: 01/03/16  4:20 PM  Result Value Ref Range Status   Specimen Description BLOOD LEFT ANTECUBITAL  Final   Special Requests   Final    BOTTLES DRAWN AEROBIC AND ANAEROBIC 5 CC Performed [facility]    Culture PENDING  Incomplete   Report Status PENDING  Incomplete  Culture, blood (routine x 2)     Status: None (Preliminary result)   Collection Time: 01/03/16  4:25 PM  Result Value Ref Range Status   Specimen Description BLOOD BLOOD LEFT FOREARM  Final   Special Requests   Final    BOTTLES DRAWN AEROBIC AND ANAEROBIC 5 CC Performed at [facility]    Culture PENDING  Incomplete   Report Status PENDING  Incomplete  CSF culture     Status: None (Preliminary result)   Collection Time: 01/03/16  6:07 PM  Result Value Ref Range Status   Specimen Description CSF  Final   Special Requests Normal  Final   Gram Stain   Final    CYTOSPIN SMEAR WBC PRESENT,BOTH PMN AND MONONUCLEAR NO ORGANISMS SEEN Gram Stain Report Called to,Read Back By and Verified With: Vira BrownsJENNIFER OXENDINE RN 4.19.17 @ 1924 BY RICEJ    Culture PENDING  Incomplete   Report Status PENDING  Incomplete    Staci RighterOMER, Anely Spiewak, MD [facility] [email or web address] [phone] pager  [phone] cell 01/04/2016, 2:47 PM

## 2016-01-05 DIAGNOSIS — A879 Viral meningitis, unspecified: Secondary | ICD-10-CM | POA: Diagnosis present

## 2016-01-05 LAB — HERPES SIMPLEX VIRUS(HSV) DNA BY PCR
HSV 1 DNA: NEGATIVE
HSV 2 DNA: NEGATIVE

## 2016-01-05 LAB — HIV ANTIBODY (ROUTINE TESTING W REFLEX): HIV Screen 4th Generation wRfx: NONREACTIVE

## 2016-01-05 MED ORDER — IBUPROFEN 200 MG PO CAPS: 400.0000 mg | ORAL_CAPSULE | Freq: Three times a day (TID) | ORAL | Status: AC | PRN

## 2016-01-05 MED ORDER — UNABLE TO FIND: Status: DC

## 2016-01-05 MED ORDER — ACETAMINOPHEN 325 MG PO TABS: 650.0000 mg | ORAL_TABLET | Freq: Four times a day (QID) | ORAL | Status: DC | PRN

## 2016-01-05 NOTE — Progress Notes (Signed)
Patient d/c home. Stable. Denies pain. Discharge instructions given , teach back utilized. Verbalized understanding.

## 2016-01-07 LAB — ENTEROVIRUS PCR: ENTEROVIRUS PCR: NEGATIVE

## 2016-01-07 LAB — CSF CULTURE W GRAM STAIN
Culture: NO GROWTH
Special Requests: NORMAL

## 2016-01-07 LAB — CULTURE, BLOOD (ROUTINE X 2)
CULTURE: NO GROWTH
Culture: NO GROWTH

## 2016-01-07 LAB — CSF CULTURE

## 2016-01-07 NOTE — Discharge Summary (Addendum)
Physician Discharge Summary  Jeff Acevedo ZOX:[id number] DOB: [birth year] DOA: 01/03/2016  PCP: Abigail Miyamoto, MD  Admit date: 01/03/2016 Discharge date: 01/05/2016  Time spent: 45 minutes  Recommendations for Outpatient Follow-up:  1. PCP in 1 week 2.   Abnormal CT findings with mild GB wall thickening and scant pelvic ascites, asymptomatic , will need repeat imaging in few months  Discharge Diagnoses:  Principal Problem:   Viral Meningitis Active Problems:   HTN (hypertension), benign   Meningitis, viral   Discharge Condition:stable  Diet recommendation:heart healthy  Filed Weights   01/03/16 2100 01/03/16 2245  Weight: 79.833 kg (176 lb) 79.788 kg (175 lb 14.4 oz)    History of present illness:  Jeff Acevedo is a 55 y.o. male with history of HTN, hyperlipidemia, cluster headaches who noted a 2 week history of headache, fever up to 104, malaise. Over the past week also with poor po intake. Symptoms relieved with ibuprofen but would come back once it wore off. [place] city, no tick exposures or wooded exposure, no unpasteurized foods/dairy, no travel. Was seen in the ED the previous night and given doxycycline but did not fill.  Hospital Course:  Viral Meningitis -elevated WBCs, lymphocytic predominance, some mild elevation in protein and glucose of 36 in CSF but this has been ongoing for 2 weeks, no meningismus on exam . -Not c/w RMSF with prolonged course either and no risk factors -was initially treated with Empiric Ceftriaxone/Vanc/Ampicillin/Acyclovir, after LP all this was stopped -ID Dr.COmer consulted -clinically much improved, CSF gram stain negative -HSV, enterovirus, crypto Ag were negative -discharged home in stable condition to continue symptomatic Rx only  Abnormal CT findings with mild GB wall thickening and scant pelvic ascites -no symptoms at all, will need repeat imaging in few months   HTN (hypertension), benign -stable, continue  lisinopril  Procedures:  Lumbar puncture  Consultations:  ID  Discharge Exam: Filed Vitals:   01/05/16 0550 01/05/16 0935  BP: 132/89 127/75  Pulse: 78   Temp: 97.9 F (36.6 C)   Resp: 16     General: AAOx3 Cardiovascular: S1S2/RRR Respiratory: CTAB  Discharge Instructions   Discharge Instructions    Diet - low sodium heart healthy    Complete by:  As directed      Increase activity slowly    Complete by:  As directed           Discharge Medication List as of 01/05/2016 11:35 AM    START taking these medications   Details  acetaminophen (TYLENOL) 325 MG tablet Take 2 tablets (650 mg total) by mouth every 6 (six) hours as needed for mild pain, fever or headache., Starting 01/05/2016, Until Discontinued, OTC    Ibuprofen 200 MG CAPS Take 2 capsules (400 mg total) by mouth every 8 (eight) hours as needed (headache/fever)., Starting 01/05/2016, Until Discontinued, OTC    UNABLE TO FIND This note is to excuse Jeff Acevedo from work due to medical illness requiring hospitalization, ok to return to work 01/09/16., Print      CONTINUE these medications which have NOT CHANGED   Details  hydrochlorothiazide 25 MG tablet Take 25 mg by mouth daily.  , Until Discontinued, Historical Med    lisinopril (PRINIVIL,ZESTRIL) 40 MG tablet Take 40 mg by mouth daily.  , Until Discontinued, Historical Med    rosuvastatin (CRESTOR) 40 MG tablet Take 40 mg by mouth daily. , Until Discontinued, Historical Med    verapamil (CALAN-SR) 180 MG CR tablet Take 360  mg by mouth at bedtime., Until Discontinued, Historical Med       No Known Allergies Follow-up Information    Follow up with PERRY,LAWRENCE EDWARD, MD. Schedule an appointment as soon as possible for a visit in 1 week.   Specialty:  Family Medicine   Contact information:   [address] [place] [id number] [phone]        The results of significant diagnostics from this hospitalization (including imaging,  microbiology, ancillary and laboratory) are listed below for reference.    Significant Diagnostic Studies: Dg Chest 2 View  01/02/2016  CLINICAL DATA:  Fever, headache, and vomiting for 1 week. History of hypertension. Current smoker. EXAM: CHEST  2 VIEW COMPARISON:  None. FINDINGS: Normal heart size and pulmonary vascularity. No focal airspace disease or consolidation in the lungs. No blunting of costophrenic angles. No pneumothorax. Mediastinal contours appear intact. Degenerative changes in the spine. Calcification of the aorta. IMPRESSION: No active cardiopulmonary disease. Electronically Signed   By: Burman NievesWilliam  Stevens M.D.   On: 01/02/2016 00:36   Ct Head Wo Contrast  01/03/2016  CLINICAL DATA:  Headache.  Rule out meningitis.  Low-grade fever. EXAM: CT HEAD WITHOUT CONTRAST TECHNIQUE: Contiguous axial images were obtained from the base of the skull through the vertex without intravenous contrast. COMPARISON:  None. FINDINGS: Ventricle size is normal. Negative for acute or chronic infarction. Negative for hemorrhage or fluid collection. Negative for mass or edema. No shift of the midline structures. Calvarium is intact. IMPRESSION: Normal Electronically Signed   By: Marlan Palauharles  Clark M.D.   On: 01/03/2016 15:48   Ct Abdomen Pelvis W Contrast  01/03/2016  CLINICAL DATA:  Headache and fever. Nausea. Loss of appetite. Vomiting and diarrhea. Weight loss. EXAM: CT ABDOMEN AND PELVIS WITH CONTRAST TECHNIQUE: Multidetector CT imaging of the abdomen and pelvis was performed using the standard protocol following bolus administration of intravenous contrast. CONTRAST:  100mL ISOVUE-300 IOPAMIDOL (ISOVUE-300) INJECTION 61% COMPARISON:  11/28/2008 ultrasound FINDINGS: Despite efforts by the technologist and patient, motion artifact is present on today's exam and could not be eliminated. This reduces exam sensitivity and specificity. Lower chest:  Unremarkable Hepatobiliary: Hypodense 5 mm lesion in segment 4 of the  liver, image 18/2, technically nonspecific due to small size, statistically likely to be a cyst. Borderline gallbladder wall thickening. Pancreas: Unremarkable Spleen: Unremarkable Adrenals/Urinary Tract: Unremarkable Stomach/Bowel: Unremarkable.  Appendix normal. Vascular/Lymphatic: Aortoiliac atherosclerotic vascular disease. No pathologic adenopathy identified. Reproductive: Unremarkable Other: Small but abnormal amount of pelvic ascites, image 64/2. Low-level mesenteric edema. Faint stranding in the right omentum. Musculoskeletal: Mild spurring of both femoral heads. Bridging spurring of the right sacroiliac joint. IMPRESSION: 1. Small but abnormal amount of pelvic ascites with low-level mesenteric edema and mild stranding in the right upper quadrant omentum. There is some mild gallbladder wall thickening. Consider gallbladder sonography for further characterization in assessing for cholecystitis. 2. 5 mm lesion in segment 4 of the liver is probably a cyst but technically too small to characterize. 3.  Aortoiliac atherosclerotic vascular disease. Electronically Signed   By: Gaylyn RongWalter  Liebkemann M.D.   On: 01/03/2016 16:12   Dg Lumbar Puncture Fluoro Guide  01/04/2016  CLINICAL DATA:  Meningitis.  Headache. EXAM: DIAGNOSTIC LUMBAR PUNCTURE UNDER FLUOROSCOPIC GUIDANCE FLUOROSCOPY TIME:  Radiation Exposure Index (as provided by the fluoroscopic device): 20.9 If the device does not provide the exposure index: Fluoroscopy Time (in minutes and seconds):  43 Number of Acquired Images:  0 PROCEDURE: Informed consent was obtained from the  patient prior to the procedure, including potential complications of headache, allergy, and pain. With the patient prone, the lower back was prepped with Betadine. 1% Lidocaine was used for local anesthesia. Lumbar puncture was performed at the L3-4 level using a 22 gauge needle with return of clear CSF with an opening pressure of 19 cm water. 10.5 ml of CSF were obtained for  laboratory studies. The patient tolerated the procedure well and there were no apparent complications. IMPRESSION: 1. Technically successful lumbar puncture. At this time pathology is pending. Electronically Signed   By: Signa Kell M.D.   On: 01/04/2016 08:22    Microbiology: Recent Results (from the past 240 hour(s))  Culture, blood (routine x 2)     Status: None   Collection Time: 01/02/16 12:15 AM  Result Value Ref Range Status   Specimen Description BLOOD RIGHT ANTECUBITAL  Final   Special Requests BOTTLES DRAWN AEROBIC AND ANAEROBIC 5CC  Final   Culture   Final    NO GROWTH 5 DAYS Performed at [facility]    Report Status 01/07/2016 FINAL  Final  Culture, blood (routine x 2)     Status: None   Collection Time: 01/02/16 12:21 AM  Result Value Ref Range Status   Specimen Description BLOOD BLOOD RIGHT FOREARM  Final   Special Requests BOTTLES DRAWN AEROBIC AND ANAEROBIC 10CC  Final   Culture   Final    NO GROWTH 5 DAYS Performed at [facility]    Report Status 01/07/2016 FINAL  Final  Culture, blood (routine x 2)     Status: None (Preliminary result)   Collection Time: 01/03/16  4:20 PM  Result Value Ref Range Status   Specimen Description BLOOD LEFT ANTECUBITAL  Final   Special Requests BOTTLES DRAWN AEROBIC AND ANAEROBIC 5 CC  Final   Culture   Final    NO GROWTH 4 DAYS Performed at [facility]    Report Status PENDING  Incomplete  Culture, blood (routine x 2)     Status: None (Preliminary result)   Collection Time: 01/03/16  4:25 PM  Result Value Ref Range Status   Specimen Description BLOOD BLOOD LEFT FOREARM  Final   Special Requests BOTTLES DRAWN AEROBIC AND ANAEROBIC 5 CC  Final   Culture   Final    NO GROWTH 4 DAYS Performed at [facility]    Report Status PENDING  Incomplete  CSF culture     Status: None   Collection Time: 01/03/16  6:07 PM  Result Value Ref Range Status   Specimen Description CSF  Final   Special  Requests Normal  Final   Gram Stain   Final    CYTOSPIN SMEAR WBC PRESENT,BOTH PMN AND MONONUCLEAR NO ORGANISMS SEEN Gram Stain Report Called to,Read Back By and Verified With: Vira Browns RN 4.19.17 @ 1924 BY RICEJ    Culture   Final    NO GROWTH 3 DAYS Performed at [facility]    Report Status 01/07/2016 FINAL  Final     Labs: Basic Metabolic Panel:  Recent Labs Lab 01/01/16 1955 01/03/16 1625 01/04/16 0351  NA 134* 138 138  K 3.6 3.4* 3.7  CL 100* 102 106  CO2 26 25 23   GLUCOSE 119* 80 115*  BUN 21* 20 15  CREATININE 0.87 0.87 0.83  CALCIUM 8.2* 8.2* 7.9*   Liver Function Tests:  Recent Labs Lab 01/01/16 1955 01/03/16 1625  AST 23 27  ALT 14* 14*  ALKPHOS 63 72  BILITOT 0.3 0.6  PROT 6.8 7.0  ALBUMIN 3.0* 3.1*    Recent Labs Lab 01/01/16 1955  LIPASE 21   No results for input(s): AMMONIA in the last 168 hours. CBC:  Recent Labs Lab 01/01/16 1955 01/03/16 1625 01/04/16 0351  WBC 8.4 7.6 7.9  NEUTROABS 6.4 4.9  --   HGB 14.0 14.2 13.2  HCT 40.8 41.9 39.3  MCV 85.9 86.0 85.8  PLT 223 208 198   Cardiac Enzymes: No results for input(s): CKTOTAL, CKMB, CKMBINDEX, TROPONINI in the last 168 hours. BNP: BNP (last 3 results) No results for input(s): BNP in the last 8760 hours.  ProBNP (last 3 results) No results for input(s): PROBNP in the last 8760 hours.  CBG: No results for input(s): GLUCAP in the last 168 hours.     SignedZannie Cove MD.  [facility] 01/07/2016, 5:36 PM

## 2016-01-08 LAB — CULTURE, BLOOD (ROUTINE X 2)
Culture: NO GROWTH
Culture: NO GROWTH

## 2016-01-12 LAB — CRYPTOCOCCAL ANTIGEN, CSF: CRYPTO AG: NEGATIVE

## 2018-06-10 DIAGNOSIS — G44001 Cluster headache syndrome, unspecified, intractable: Secondary | ICD-10-CM | POA: Diagnosis not present

## 2018-06-10 DIAGNOSIS — I10 Essential (primary) hypertension: Secondary | ICD-10-CM | POA: Diagnosis not present

## 2018-06-10 DIAGNOSIS — G500 Trigeminal neuralgia: Secondary | ICD-10-CM | POA: Diagnosis not present

## 2018-06-10 DIAGNOSIS — E782 Mixed hyperlipidemia: Secondary | ICD-10-CM | POA: Diagnosis not present

## 2018-12-09 DIAGNOSIS — E782 Mixed hyperlipidemia: Secondary | ICD-10-CM | POA: Diagnosis not present

## 2018-12-09 DIAGNOSIS — I10 Essential (primary) hypertension: Secondary | ICD-10-CM | POA: Diagnosis not present

## 2019-01-18 DIAGNOSIS — J0100 Acute maxillary sinusitis, unspecified: Secondary | ICD-10-CM | POA: Diagnosis not present

## 2019-01-18 DIAGNOSIS — Z1159 Encounter for screening for other viral diseases: Secondary | ICD-10-CM | POA: Diagnosis not present

## 2019-01-27 DIAGNOSIS — G500 Trigeminal neuralgia: Secondary | ICD-10-CM | POA: Diagnosis not present

## 2019-02-16 NOTE — Progress Notes (Signed)
Virtual Visit via Video Note The purpose of this virtual visit is to provide medical care while limiting exposure to the novel coronavirus.    Consent was obtained for video visit:  Yes.   Answered questions that patient had about telehealth interaction:  Yes.   I discussed the limitations, risks, security and privacy concerns of performing an evaluation and management service by telemedicine. I also discussed with the patient that there may be a patient responsible charge related to this service. The patient expressed understanding and agreed to proceed.  Pt location: Home Physician Location: Home Name of referring provider:  Abigail Miyamoto,* I connected with Jeff Acevedo at patients initiation/request on 02/17/2019 at  9:10 AM EDT by video enabled telemedicine application and verified that I am speaking with the correct person using two identifiers. Pt MRN:  [id number] Pt DOB:  [birth year] Video Participants:  Jeff Acevedo;  His wife   History of Present Illness:  Jeff Acevedo is a 58 year old male with hypertension, cluster headache and history of viral meningitis in 2017 who presents for right-sided facial pain.   For the last 1 1/2 years, he has had severe right sided facial pain from right nostril down to the jaw.  Initially, he had a tooth extraction but pain never resolved.  He was then treated for sinus infection.  X-ray of sinuses were negative.  It is a severe shooting paroxysmal pain, lasting 10 to 15 minutes but other times 45-60 minutes.  It occurs every couple of day for several months.  In March to April, it occurred daily, often waking him up at night.  No associated nausea, conjunctival injection, ptosis, but noted right sided facial redness and nasal congestion.  No specific triggers such as talking, eating or brushing teeth.  It would occur spontaneously and abort spontaneously.  He was started on amitriptyline and pain has resolved since April except for a  mild episode a couple of days ago.   He has a past history of cluster headaches, which were left-sided.  They are controlled.  He takes verapamil.  He has history of viral meningitis in 2017.  Medications: amitriptyline 25mg  at bedtime, verapamil 360mg  CR daily, ibuprofen  Past Medical History: Past Medical History:  Diagnosis Date  . Cluster headache   . HTN (hypertension)   . Hypotension     Medications: Outpatient Encounter Medications as of 02/17/2019  Medication Sig Note  . acetaminophen (TYLENOL) 325 MG tablet Take 2 tablets (650 mg total) by mouth every 6 (six) hours as needed for mild pain, fever or headache.   . hydrochlorothiazide 25 MG tablet Take 25 mg by mouth daily.     . Ibuprofen 200 MG CAPS Take 2 capsules (400 mg total) by mouth every 8 (eight) hours as needed (headache/fever).   Marland Kitchen lisinopril (PRINIVIL,ZESTRIL) 40 MG tablet Take 40 mg by mouth daily.     . rosuvastatin (CRESTOR) 40 MG tablet Take 40 mg by mouth daily.    Marland Kitchen UNABLE TO FIND This note is to excuse [name] from work due to medical illness requiring hospitalization, ok to return to work 01/09/16.   Marland Kitchen verapamil (CALAN-SR) 180 MG CR tablet Take 360 mg by mouth at bedtime. 01/03/2016: Pt stated he took only 1 capsule this morning but vomited medication.   No facility-administered encounter medications on file as of 02/17/2019.     Allergies: No Known Allergies  Family History: Family History  Problem Relation Age of Onset  .  Heart failure Unknown     Social History: Social History   Socioeconomic History  . Marital status: Married    Spouse name: Not on file  . Number of children: 2  . Years of education: Not on file  . Highest education level: Not on file  Occupational History  . Occupation: lift & build furnitu  Social Needs  . Financial resource strain: Not on file  . Food insecurity:    Worry: Not on file    Inability: Not on file  . Transportation needs:    Medical: Not on file     Non-medical: Not on file  Tobacco Use  . Smoking status: Current Every Day Smoker    Packs/day: 1.00    Years: 21.00    Pack years: 21.00  Substance and Sexual Activity  . Alcohol use: No  . Drug use: No  . Sexual activity: Not on file  Lifestyle  . Physical activity:    Days per week: Not on file    Minutes per session: Not on file  . Stress: Not on file  Relationships  . Social connections:    Talks on phone: Not on file    Gets together: Not on file    Attends religious service: Not on file    Active member of club or organization: Not on file    Attends meetings of clubs or organizations: Not on file    Relationship status: Not on file  . Intimate partner violence:    Fear of current or ex partner: Not on file    Emotionally abused: Not on file    Physically abused: Not on file    Forced sexual activity: Not on file  Other Topics Concern  . Not on file  Social History Narrative  . Not on file   Observations/Objective:   Height 6\' 1"  (1.854 m), weight 190 lb (86.2 kg). No acute distress.  Alert and oriented.  Speech fluent and not dysarthric.  Language intact.  Face symmetric.    Assessment and Plan:   Right sided trigeminal neuralgia.  He endorses some autonomic symptoms (facial redness, nasal congestion) which may suggest cluster headaches (he has prior history) but semiology otherwise classic for trigeminal neuralgia.  Pain has improved over the past 2 months.  Possibly due to amitriptyline or the warmer weather.  1.  He will continue amitriptyline 25mg  at bedtime for now.  Refill today. 2.  Follow up in 4 months (when weather may start cooling). 3.  Until then, if he has a recurrence of pain, I would likely switch amitriptyline to oxcarbazepine.  Follow Up Instructions:    -I discussed the assessment and treatment plan with the patient. The patient was provided an opportunity to ask questions and all were answered. The patient agreed with the plan and  demonstrated an understanding of the instructions.   The patient was advised to call back or seek an in-person evaluation if the symptoms worsen or if the condition fails to improve as anticipated.  Time spent with patient:  40 minutes.  Cira ServantAdam Robert Leoncio Hansen, DO

## 2019-02-17 ENCOUNTER — Encounter: Payer: Self-pay | Admitting: Neurology

## 2019-02-17 ENCOUNTER — Other Ambulatory Visit: Payer: Self-pay

## 2019-02-17 ENCOUNTER — Telehealth (INDEPENDENT_AMBULATORY_CARE_PROVIDER_SITE_OTHER): Payer: BC Managed Care – PPO | Admitting: Neurology

## 2019-02-17 VITALS — Ht 73.0 in | Wt 190.0 lb

## 2019-02-17 DIAGNOSIS — G500 Trigeminal neuralgia: Secondary | ICD-10-CM | POA: Diagnosis not present

## 2019-02-17 MED ORDER — AMITRIPTYLINE HCL 25 MG PO TABS: 25.0000 mg | ORAL_TABLET | Freq: Every day | ORAL | 3 refills | Status: DC

## 2019-06-20 NOTE — Progress Notes (Signed)
° °  Virtual Visit via Video Note The purpose of this virtual visit is to provide medical care while limiting exposure to the novel coronavirus.    Consent was obtained for video visit:  Yes.   Answered questions that patient had about telehealth interaction:  Yes.   I discussed the limitations, risks, security and privacy concerns of performing an evaluation and management service by telemedicine. I also discussed with the patient that there may be a patient responsible charge related to this service. The patient expressed understanding and agreed to proceed.  Pt location: Home Physician Location: office Name of referring provider:  Lillard Anes,* I connected with Jeff Acevedo at patients initiation/request on 06/21/2019 at  2:30 PM EDT by video enabled telemedicine application and verified that I am speaking with the correct person using two identifiers. Pt MRN:  [id number] Pt DOB:  05/16/1961 Video Participants:  Jeff Acevedo  History of Present Illness:  Jeff Acevedo is a 58 year old male with hypertension, cluster headache and history of viral meningitis in 2017 who follows up for right sided trigeminal neuralgia.  UPDATE: Medications: amitriptyline 25mg  at bedtime PRN, verapamil 360mg  CR daily, ibuprofen   Following last visit, his pain subsided and he stopped taking them daily.  He had been pain-free for 3 months until last night.  He had four attacks.    HISTORY: Since 2018, he has had severe right sided facial pain from right nostril down to the jaw.  Initially, he had a tooth extraction but pain never resolved.  He was then treated for sinus infection.  X-ray of sinuses were negative.  It is a severe shooting paroxysmal pain, lasting 10 to 15 minutes but other times 45-60 minutes.  It occurs every couple of day for several months.  In March to April, it occurred daily, often waking him up at night.  No associated nausea, conjunctival injection, ptosis, but noted  right sided facial redness and nasal congestion.  No specific triggers such as talking, eating or brushing teeth.  It would occur spontaneously and abort spontaneously.  He was started on amitriptyline and pain has resolved since April except for a mild episode a couple of days ago.   He has a past history of cluster headaches, which were left-sided.  They are controlled.  He takes verapamil.  He has history of viral meningitis in 2017.  Observations/Objective:   Height 6\' 1"  (1.854 m), weight 205 lb (93 kg). No acute distress.  Alert and oriented.  Speech fluent and not dysarthric.  Language intact.  Face symmetric.   Assessment and Plan:   Right-sided trigeminal neuralgia.  As we are moving into cooler weather, I advised to start taking the amitriptyline every night.  Once we are again in Spring, he can try discontinuing the medication again.  1.  Amitriptyline 25mg  at bedtime 2.  Follow up in 4 months.  Follow Up Instructions:    -I discussed the assessment and treatment plan with the patient. The patient was provided an opportunity to ask questions and all were answered. The patient agreed with the plan and demonstrated an understanding of the instructions.   The patient was advised to call back or seek an in-person evaluation if the symptoms worsen or if the condition fails to improve as anticipated.    Total Time spent in visit with the patient was:  15 minutes.   Dudley Major, DO

## 2019-06-21 ENCOUNTER — Encounter: Payer: Self-pay | Admitting: Neurology

## 2019-06-21 ENCOUNTER — Other Ambulatory Visit: Payer: Self-pay

## 2019-06-21 ENCOUNTER — Telehealth (INDEPENDENT_AMBULATORY_CARE_PROVIDER_SITE_OTHER): Payer: BC Managed Care – PPO | Admitting: Neurology

## 2019-06-21 VITALS — Ht 73.0 in | Wt 205.0 lb

## 2019-06-21 DIAGNOSIS — G500 Trigeminal neuralgia: Secondary | ICD-10-CM | POA: Diagnosis not present

## 2019-11-02 NOTE — Progress Notes (Signed)
   Due to the COVID-19 crisis, this virtual check-in visit was done via telephone from my office and it was initiated and consent given by this patient and or family.   Telephone (Audio) Visit The purpose of this telephone visit is to provide medical care while limiting exposure to the novel coronavirus.    Consent was obtained for telephone visit and initiated by pt/family:  Yes.   Answered questions that patient had about telehealth interaction:  Yes.   I discussed the limitations, risks, security and privacy concerns of performing an evaluation and management service by telephone. I also discussed with the patient that there may be a patient responsible charge related to this service. The patient expressed understanding and agreed to proceed.  Pt location: Home Physician Location: office Name of referring provider:  Abigail Miyamoto,* I connected with .Jeff Acevedo at patients initiation/request on 11/03/2019 at  9:50 AM EST by telephone and verified that I am speaking with the correct person using two identifiers.  Pt MRN:  [id number] Pt DOB:  [birth year]   History of Present Illness:  Jeff Acevedo is a 59 year old male with hypertension, cluster headacheand history of viral meningitis in 2017 who follows up for right sided trigeminal neuralgia.  UPDATE: Medications: amitriptyline 25mg  at bedtime, verapamil 360mg  CR daily, ibuprofen  Trigeminal nerve attacks are more mild, lasting no longer than 5 minutes and occurring about once a week.  It is manageable.  It tends to be aggravated by weather change.    HISTORY: Since 2018, he has had severe right sided facial pain from right nostril down to the jaw. Initially, he had a tooth extraction but pain never resolved. He was then treated for sinus infection. X-ray of sinuses were negative. It is a severe shooting paroxysmal pain, lasting 10 to 15 minutes but other times 45-60 minutes. It occurs every couple of day for  several months. In March to April, it occurred daily, often waking him up at night. No associated nausea, conjunctival injection, ptosis, but noted right sided facial redness and nasal congestion. No specific triggers such as talking, eating or brushing teeth. It would occur spontaneously and abort spontaneously. He was started on amitriptyline and pain has resolved since April except for a mild episode a couple of days ago.   He has a past history of cluster headaches, which were left-sided. They are controlled. He takes verapamil. He has history of viral meningitis in 2017.  Observations/Objective:   There were no vitals filed for this visit.  Assessment and Plan:   Right sided trigeminal neuralgia.  I recommend continuing amitriptyline 25mg  every night for at least another month (until the weather is more mild).  At that point, he may decide to discontinue it and see how he does (asked that he update me when he decides to stop it).  Otherwise, follow up in 6 months.   Need for in person visit now:  No. Follow Up Instructions:    -I discussed the assessment and treatment plan with the patient. The patient was provided an opportunity to ask questions and all were answered. The patient agreed with the plan and demonstrated an understanding of the instructions.   The patient was advised to call back or seek an in-person evaluation if the symptoms worsen or if the condition fails to improve as anticipated.    Total Time spent in visit with the patient was:  8 minutes   May, DO

## 2019-11-03 ENCOUNTER — Telehealth (INDEPENDENT_AMBULATORY_CARE_PROVIDER_SITE_OTHER): Payer: BC Managed Care – PPO | Admitting: Neurology

## 2019-11-03 ENCOUNTER — Other Ambulatory Visit: Payer: Self-pay

## 2019-11-03 ENCOUNTER — Encounter: Payer: Self-pay | Admitting: Neurology

## 2019-11-03 DIAGNOSIS — G500 Trigeminal neuralgia: Secondary | ICD-10-CM | POA: Diagnosis not present

## 2019-11-09 ENCOUNTER — Other Ambulatory Visit: Payer: Self-pay

## 2019-11-09 DIAGNOSIS — Z20828 Contact with and (suspected) exposure to other viral communicable diseases: Secondary | ICD-10-CM

## 2019-11-10 ENCOUNTER — Other Ambulatory Visit (INDEPENDENT_AMBULATORY_CARE_PROVIDER_SITE_OTHER): Payer: BC Managed Care – PPO

## 2019-11-10 ENCOUNTER — Other Ambulatory Visit: Payer: Self-pay

## 2019-11-10 DIAGNOSIS — Z20828 Contact with and (suspected) exposure to other viral communicable diseases: Secondary | ICD-10-CM | POA: Diagnosis not present

## 2019-11-10 NOTE — Patient Instructions (Signed)
COVID-19 COVID-19 is a respiratory infection that is caused by a virus called severe acute respiratory syndrome coronavirus 2 (SARS-CoV-2). The disease is also known as coronavirus disease or novel coronavirus. In some people, the virus may not cause any symptoms. In others, it may cause a serious infection. The infection can get worse quickly and can lead to complications, such as:  Pneumonia, or infection of the lungs.  Acute respiratory distress syndrome or ARDS. This is a condition in which fluid build-up in the lungs prevents the lungs from filling with air and passing oxygen into the blood.  Acute respiratory failure. This is a condition in which there is not enough oxygen passing from the lungs to the body or when carbon dioxide is not passing from the lungs out of the body.  Sepsis or septic shock. This is a serious bodily reaction to an infection.  Blood clotting problems.  Secondary infections due to bacteria or fungus.  Organ failure. This is when your body's organs stop working. The virus that causes COVID-19 is contagious. This means that it can spread from person to person through droplets from coughs and sneezes (respiratory secretions). What are the causes? This illness is caused by a virus. You may catch the virus by:  Breathing in droplets from an infected person. Droplets can be spread by a person breathing, speaking, singing, coughing, or sneezing.  Touching something, like a table or a doorknob, that was exposed to the virus (contaminated) and then touching your mouth, nose, or eyes. What increases the risk? Risk for infection You are more likely to be infected with this virus if you:  Are within 6 feet (2 meters) of a person with COVID-19.  Provide care for or live with a person who is infected with COVID-19.  Spend time in crowded indoor spaces or live in shared housing. Risk for serious illness You are more likely to become seriously ill from the virus if you:   Are 50 years of age or older. The higher your age, the more you are at risk for serious illness.  Live in a nursing home or long-term care facility.  Have cancer.  Have a long-term (chronic) disease such as: ? Chronic lung disease, including chronic obstructive pulmonary disease or asthma. ? A long-term disease that lowers your body's ability to fight infection (immunocompromised). ? Heart disease, including heart failure, a condition in which the arteries that lead to the heart become narrow or blocked (coronary artery disease), a disease which makes the heart muscle thick, weak, or stiff (cardiomyopathy). ? Diabetes. ? Chronic kidney disease. ? Sickle cell disease, a condition in which red blood cells have an abnormal "sickle" shape. ? Liver disease.  Are obese. What are the signs or symptoms? Symptoms of this condition can range from mild to severe. Symptoms may appear any time from 2 to 14 days after being exposed to the virus. They include:  A fever or chills.  A cough.  Difficulty breathing.  Headaches, body aches, or muscle aches.  Runny or stuffy (congested) nose.  A sore throat.  New loss of taste or smell. Some people may also have stomach problems, such as nausea, vomiting, or diarrhea. Other people may not have any symptoms of COVID-19. How is this diagnosed? This condition may be diagnosed based on:  Your signs and symptoms, especially if: ? You live in an area with a COVID-19 outbreak. ? You recently traveled to or from an area where the virus is common. ? You   provide care for or live with a person who was diagnosed with COVID-19. ? You were exposed to a person who was diagnosed with COVID-19.  A physical exam.  Lab tests, which may include: ? Taking a sample of fluid from the back of your nose and throat (nasopharyngeal fluid), your nose, or your throat using a swab. ? A sample of mucus from your lungs (sputum). ? Blood tests.  Imaging tests, which  may include, X-rays, CT scan, or ultrasound. How is this treated? At present, there is no medicine to treat COVID-19. Medicines that treat other diseases are being used on a trial basis to see if they are effective against COVID-19. Your health care provider will talk with you about ways to treat your symptoms. For most people, the infection is mild and can be managed at home with rest, fluids, and over-the-counter medicines. Treatment for a serious infection usually takes places in a hospital intensive care unit (ICU). It may include one or more of the following treatments. These treatments are given until your symptoms improve.  Receiving fluids and medicines through an IV.  Supplemental oxygen. Extra oxygen is given through a tube in the nose, a face mask, or a hood.  Positioning you to lie on your stomach (prone position). This makes it easier for oxygen to get into the lungs.  Continuous positive airway pressure (CPAP) or bi-level positive airway pressure (BPAP) machine. This treatment uses mild air pressure to keep the airways open. A tube that is connected to a motor delivers oxygen to the body.  Ventilator. This treatment moves air into and out of the lungs by using a tube that is placed in your windpipe.  Tracheostomy. This is a procedure to create a hole in the neck so that a breathing tube can be inserted.  Extracorporeal membrane oxygenation (ECMO). This procedure gives the lungs a chance to recover by taking over the functions of the heart and lungs. It supplies oxygen to the body and removes carbon dioxide. Follow these instructions at home: Lifestyle  If you are sick, stay home except to get medical care. Your health care provider will tell you how long to stay home. Call your health care provider before you go for medical care.  Rest at home as told by your health care provider.  Do not use any products that contain nicotine or tobacco, such as cigarettes, e-cigarettes, and  chewing tobacco. If you need help quitting, ask your health care provider.  Return to your normal activities as told by your health care provider. Ask your health care provider what activities are safe for you. General instructions  Take over-the-counter and prescription medicines only as told by your health care provider.  Drink enough fluid to keep your urine pale yellow.  Keep all follow-up visits as told by your health care provider. This is important. How is this prevented?  There is no vaccine to help prevent COVID-19 infection. However, there are steps you can take to protect yourself and others from this virus. To protect yourself:   Do not travel to areas where COVID-19 is a risk. The areas where COVID-19 is reported change often. To identify high-risk areas and travel restrictions, check the CDC travel website: wwwnc.cdc.gov/travel/notices  If you live in, or must travel to, an area where COVID-19 is a risk, take precautions to avoid infection. ? Stay away from people who are sick. ? Wash your hands often with soap and water for 20 seconds. If soap and water   are not available, use an alcohol-based hand sanitizer. ? Avoid touching your mouth, face, eyes, or nose. ? Avoid going out in public, follow guidance from your state and local health authorities. ? If you must go out in public, wear a cloth face covering or face mask. Make sure your mask covers your nose and mouth. ? Avoid crowded indoor spaces. Stay at least 6 feet (2 meters) away from others. ? Disinfect objects and surfaces that are frequently touched every day. This may include:  Counters and tables.  Doorknobs and light switches.  Sinks and faucets.  Electronics, such as phones, remote controls, keyboards, computers, and tablets. To protect others: If you have symptoms of COVID-19, take steps to prevent the virus from spreading to others.  If you think you have a COVID-19 infection, contact your health care  provider right away. Tell your health care team that you think you may have a COVID-19 infection.  Stay home. Leave your house only to seek medical care. Do not use public transport.  Do not travel while you are sick.  Wash your hands often with soap and water for 20 seconds. If soap and water are not available, use alcohol-based hand sanitizer.  Stay away from other members of your household. Let healthy household members care for children and pets, if possible. If you have to care for children or pets, wash your hands often and wear a mask. If possible, stay in your own room, separate from others. Use a different bathroom.  Make sure that all people in your household wash their hands well and often.  Cough or sneeze into a tissue or your sleeve or elbow. Do not cough or sneeze into your hand or into the air.  Wear a cloth face covering or face mask. Make sure your mask covers your nose and mouth. Where to find more information  Centers for Disease Control and Prevention: www.cdc.gov/coronavirus/2019-ncov/index.html  World Health Organization: www.who.int/health-topics/coronavirus Contact a health care provider if:  You live in or have traveled to an area where COVID-19 is a risk and you have symptoms of the infection.  You have had contact with someone who has COVID-19 and you have symptoms of the infection. Get help right away if:  You have trouble breathing.  You have pain or pressure in your chest.  You have confusion.  You have bluish lips and fingernails.  You have difficulty waking from sleep.  You have symptoms that get worse. These symptoms may represent a serious problem that is an emergency. Do not wait to see if the symptoms will go away. Get medical help right away. Call your local emergency services (911 in the [place] Do not drive yourself to the hospital. Let the emergency medical personnel know if you think you have COVID-19. Summary  COVID-19 is a  respiratory infection that is caused by a virus. It is also known as coronavirus disease or novel coronavirus. It can cause serious infections, such as pneumonia, acute respiratory distress syndrome, acute respiratory failure, or sepsis.  The virus that causes COVID-19 is contagious. This means that it can spread from person to person through droplets from breathing, speaking, singing, coughing, or sneezing.  You are more likely to develop a serious illness if you are 50 years of age or older, have a weak immune system, live in a nursing home, or have chronic disease.  There is no medicine to treat COVID-19. Your health care provider will talk with you about ways to treat your symptoms.    Take steps to protect yourself and others from infection. Wash your hands often and disinfect objects and surfaces that are frequently touched every day. Stay away from people who are sick and wear a mask if you are sick. This information is not intended to replace advice given to you by your health care provider. Make sure you discuss any questions you have with your health care provider. Document Revised: 07/02/2019 Document Reviewed: 10/08/2018 Elsevier Patient Education  2020 [employer]  

## 2019-11-10 NOTE — Progress Notes (Signed)
Patient Name: MARVEN VELEY Date of Birth: 05-26-1961 MRN:  [id number]  KIRT CHEW is a 59 y.o. yo male presenting for COVID-19 testing.  He is being tested from the vehicle.  MURICE BARBAR is being tested due to positive COVID exposure.  His wife is positive.  He denies symptoms at this time.  Patient understands that he will be notified of result as soon as we receive them, usually within 24-48 hours.  Printed and verbal information given to patient according to CDC Guidelines.  Jacklynn Bue, LPN 82:80 AM

## 2019-11-11 LAB — NOVEL CORONAVIRUS, NAA: SARS-CoV-2, NAA: DETECTED — AB

## 2019-11-11 NOTE — Progress Notes (Signed)
Patient is positive for covid lp

## 2019-11-12 ENCOUNTER — Ambulatory Visit (HOSPITAL_COMMUNITY)
Admission: RE | Admit: 2019-11-12 | Discharge: 2019-11-12 | Disposition: A | Discharge status: Home / Self Care | Payer: BC Managed Care – PPO | Source: Ambulatory Visit | Attending: Pulmonary Disease | Admitting: Pulmonary Disease

## 2019-11-12 ENCOUNTER — Other Ambulatory Visit: Payer: Self-pay | Admitting: Physician Assistant

## 2019-11-12 DIAGNOSIS — U071 COVID-19: Secondary | ICD-10-CM | POA: Diagnosis not present

## 2019-11-12 DIAGNOSIS — I10 Essential (primary) hypertension: Secondary | ICD-10-CM

## 2019-11-12 MED ORDER — SODIUM CHLORIDE 0.9 % IV SOLN
700.0000 mg | Freq: Once | INTRAVENOUS | Status: AC
  Administered 2019-11-12: 700 mg via INTRAVENOUS
  Filled 2019-11-12: qty 20

## 2019-11-12 MED ORDER — METHYLPREDNISOLONE SODIUM SUCC 125 MG IJ SOLR: 125.0000 mg | Freq: Once | INTRAMUSCULAR | Status: DC | PRN

## 2019-11-12 MED ORDER — DIPHENHYDRAMINE HCL 50 MG/ML IJ SOLN: 50.0000 mg | Freq: Once | INTRAMUSCULAR | Status: DC | PRN

## 2019-11-12 MED ORDER — ALBUTEROL SULFATE HFA 108 (90 BASE) MCG/ACT IN AERS: 2.0000 | INHALATION_SPRAY | Freq: Once | RESPIRATORY_TRACT | Status: DC | PRN

## 2019-11-12 MED ORDER — SODIUM CHLORIDE 0.9 % IV SOLN
INTRAVENOUS | Status: DC | PRN
  Administered 2019-11-12: 250 mL via INTRAVENOUS

## 2019-11-12 MED ORDER — EPINEPHRINE 0.3 MG/0.3ML IJ SOAJ: 0.3000 mg | Freq: Once | INTRAMUSCULAR | Status: DC | PRN

## 2019-11-12 MED ORDER — FAMOTIDINE IN NACL 20-0.9 MG/50ML-% IV SOLN: 20.0000 mg | Freq: Once | INTRAVENOUS | Status: DC | PRN

## 2019-11-12 NOTE — Progress Notes (Signed)
  Diagnosis: COVID-19  Physician: Dr. Wright  Procedure: [facility] Med: bamlanivimab infusion - Provided patient with bamlanimivab fact sheet for patients, parents and caregivers prior to infusion.  Complications: No immediate complications noted.  Discharge: Discharged home   Jeff Acevedo S Clair [place] 11/12/2019  

## 2019-11-12 NOTE — Progress Notes (Signed)
  I connected by phone with Jeff Acevedo on 11/12/2019 at 8:13 AM to discuss the potential use of an new treatment for mild to moderate COVID-19 viral infection in non-hospitalized patients.  This patient is a 59 y.o. male that meets the FDA criteria for Emergency Use Authorization of bamlanivimab or casirivimab\imdevimab.  Has a (+) direct SARS-CoV-2 viral test result  Has mild or moderate COVID-19   Is ? 59 years of age and weighs ? 40 kg  Is NOT hospitalized due to COVID-19  Is NOT requiring oxygen therapy or requiring an increase in baseline oxygen flow rate due to COVID-19  Is within 10 days of symptom onset  Has at least one of the high risk factor(s) for progression to severe COVID-19 and/or hospitalization as defined in EUA.  Specific high risk criteria : Hypertension   I have spoken and communicated the following to the patient or parent/caregiver:  1. FDA has authorized the emergency use of bamlanivimab and casirivimab\imdevimab for the treatment of mild to moderate COVID-19 in adults and pediatric patients with positive results of direct SARS-CoV-2 viral testing who are 8 years of age and older weighing at least 40 kg, and who are at high risk for progressing to severe COVID-19 and/or hospitalization.  2. The significant known and potential risks and benefits of bamlanivimab and casirivimab\imdevimab, and the extent to which such potential risks and benefits are unknown.  3. Information on available alternative treatments and the risks and benefits of those alternatives, including clinical trials.  4. Patients treated with bamlanivimab and casirivimab\imdevimab should continue to self-isolate and use infection control measures (e.g., wear mask, isolate, social distance, avoid sharing personal items, clean and disinfect "high touch" surfaces, and frequent handwashing) according to CDC guidelines.   5. The patient or parent/caregiver has the option to accept or refuse  bamlanivimab or casirivimab\imdevimab .  After reviewing this information with the patient, The patient agreed to proceed with receiving the bamlanimivab infusion and will be provided a copy of the Fact sheet prior to receiving the infusion.   Pt set up for infusion today with wife at 10:30am. Sx onset 2/20. Directions given. Orders in.   Jeff Crock PA-C 11/12/2019 8:13 AM

## 2019-11-12 NOTE — Discharge Instructions (Signed)
10 Things You Can Do to Manage Your COVID-19 Symptoms at Home If you have possible or confirmed COVID-19: 1. Stay home from work and school. And stay away from other public places. If you must go out, avoid using any kind of public transportation, ridesharing, or taxis. 2. Monitor your symptoms carefully. If your symptoms get worse, call your healthcare provider immediately. 3. Get rest and stay hydrated. 4. If you have a medical appointment, call the healthcare provider ahead of time and tell them that you have or may have COVID-19. 5. For medical emergencies, call 911 and notify the dispatch personnel that you have or may have COVID-19. 6. Cover your cough and sneezes with a tissue or use the inside of your elbow. 7. Wash your hands often with soap and water for at least 20 seconds or clean your hands with an alcohol-based hand sanitizer that contains at least 60% alcohol. 8. As much as possible, stay in a specific room and away from other people in your home. Also, you should use a separate bathroom, if available. If you need to be around other people in or outside of the home, wear a mask. 9. Avoid sharing personal items with other people in your household, like dishes, towels, and bedding. 10. Clean all surfaces that are touched often, like counters, tabletops, and doorknobs. Use household cleaning sprays or wipes according to the label instructions. cdc.gov/coronavirus What types of side effects do monoclonal antibody drugs cause?  Common side effects  In general, the more common side effects caused by monoclonal antibody drugs include: . Allergic reactions, such as hives or itching . Flu-like signs and symptoms, including chills, fatigue, fever, and muscle aches and pains . Nausea, vomiting . Diarrhea . Skin rashes . Low blood pressure   The CDC is recommending patients who receive monoclonal antibody treatments wait at least 90 days before being vaccinated.  Currently, there are no  data on the safety and efficacy of mRNA COVID-19 vaccines in persons who received monoclonal antibodies or convalescent plasma as part of COVID-19 treatment. Based on the estimated half-life of such therapies as well as evidence suggesting that reinfection is uncommon in the 90 days after initial infection, vaccination should be deferred for at least 90 days, as a precautionary measure until additional information becomes available, to avoid interference of the antibody treatment with vaccine-induced immune responses. 

## 2019-11-18 ENCOUNTER — Other Ambulatory Visit: Payer: Self-pay | Admitting: Neurology

## 2019-11-18 ENCOUNTER — Other Ambulatory Visit: Payer: Self-pay

## 2019-11-18 ENCOUNTER — Telehealth: Payer: Self-pay | Admitting: Neurology

## 2019-11-18 DIAGNOSIS — G500 Trigeminal neuralgia: Secondary | ICD-10-CM

## 2019-11-18 MED ORDER — OXCARBAZEPINE 150 MG PO TABS: 150.0000 mg | ORAL_TABLET | Freq: Two times a day (BID) | ORAL | 3 refills | Status: DC

## 2019-11-18 NOTE — Telephone Encounter (Signed)
Please advise on recommendations.

## 2019-11-18 NOTE — Telephone Encounter (Signed)
Patient wife called and states patient is having really bad facial pain and needs to know what to do or if they need to be seen in the office   Please call

## 2019-11-18 NOTE — Telephone Encounter (Signed)
Sent rx to sign

## 2019-11-18 NOTE — Telephone Encounter (Signed)
I would like for him to start oxcarbazepine 150mg  twice daily.  I would like to check a basic metabolic panel for medication management.  He should make an in-office follow up visit in a couple of months.

## 2019-11-19 ENCOUNTER — Other Ambulatory Visit: Payer: BC Managed Care – PPO

## 2019-11-25 ENCOUNTER — Other Ambulatory Visit: Payer: Self-pay | Admitting: Family Medicine

## 2019-12-22 ENCOUNTER — Other Ambulatory Visit: Payer: Self-pay | Admitting: Legal Medicine

## 2019-12-22 DIAGNOSIS — I10 Essential (primary) hypertension: Secondary | ICD-10-CM

## 2020-01-02 ENCOUNTER — Other Ambulatory Visit: Payer: Self-pay | Admitting: Legal Medicine

## 2020-01-03 ENCOUNTER — Other Ambulatory Visit: Payer: Self-pay | Admitting: Family Medicine

## 2020-04-12 ENCOUNTER — Other Ambulatory Visit: Payer: Self-pay | Admitting: Legal Medicine

## 2020-04-27 ENCOUNTER — Other Ambulatory Visit: Payer: Self-pay | Admitting: Legal Medicine

## 2020-05-01 NOTE — Progress Notes (Deleted)
NEUROLOGY FOLLOW UP OFFICE NOTE  Jeff Acevedo [id number]  HISTORY OF PRESENT ILLNESS: Jeff Acevedo is a30year old male with hypertension, cluster headacheand history of viral meningitis in 2017 whofollows up for right sided trigeminal neuralgia.  UPDATE: Medications: oxcarbazepine 150mg  twice daily, amitriptyline 25mg  at bedtime, verapamil 360mg  CR daily, ibuprofen  In March, he started having increased flares.  He was started on oxcarbazepine 150mg  twice daily.  ***  HISTORY: Since 2018, he has had severe right sided facial pain from right nostril down to the jaw. Initially, he had a tooth extraction but pain never resolved. He was then treated for sinus infection. X-ray of sinuses were negative. It is a severe shooting paroxysmal pain, lasting 10 to 15 minutes but other times 45-60 minutes. It occurs every couple of day for several months. In March to April, it occurred daily, often waking him up at night. No associated nausea, conjunctival injection, ptosis, but noted right sided facial redness and nasal congestion. No specific triggers such as talking, eating or brushing teeth. It would occur spontaneously and abort spontaneously. He was started on amitriptyline and pain has resolved since April except for a mild episode a couple of days ago.   He has a past history of cluster headaches, which were left-sided. They are controlled. He takes verapamil. He has history of viral meningitis in 2017.  PAST MEDICAL HISTORY: Past Medical History:  Diagnosis Date  . Cluster headache   . HTN (hypertension)   . Hypotension     MEDICATIONS: Current Outpatient Medications on File Prior to Visit  Medication Sig Dispense Refill  . amitriptyline (ELAVIL) 25 MG tablet Take 1 tablet (25 mg total) by mouth 2 (two) times daily. Must come in for an appt with Dr. April in the next month.  Thank you, Dr. May 60 tablet 0  . captopril (CAPOTEN) 100 MG tablet TAKE 1 TABLET BY MOUTH  EVERY DAY 90 tablet 2  . hydrochlorothiazide (HYDRODIURIL) 25 MG tablet TAKE 1 TABLET BY MOUTH EVERY MORNING 90 tablet 2  . Ibuprofen 200 MG CAPS Take 2 capsules (400 mg total) by mouth every 8 (eight) hours as needed (headache/fever).  0  . OXcarbazepine (TRILEPTAL) 150 MG tablet Take 1 tablet (150 mg total) by mouth 2 (two) times daily. 60 tablet 3  . rosuvastatin (CRESTOR) 40 MG tablet TAKE 1 TABLET BY MOUTH EVERY DAY 30 tablet 6  . verapamil (CALAN-SR) 180 MG CR tablet Take 360 mg by mouth daily.     . verapamil (VERELAN PM) 180 MG 24 hr capsule TAKE 1 CAPSULE BY MOUTH TWICE A DAY 180 capsule 2   No current facility-administered medications on file prior to visit.    ALLERGIES: No Known Allergies  FAMILY HISTORY: Family History  Problem Relation Age of Onset  . Heart failure Other   . Heart failure Father     SOCIAL HISTORY: Social History   Socioeconomic History  . Marital status: Married    Spouse name: Not on file  . Number of children: 2  . Years of education: Not on file  . Highest education level: Not on file  Occupational History  . Occupation: lift & build furnitu  Tobacco Use  . Smoking status: Current Every Day Smoker    Packs/day: 1.00    Years: 21.00    Pack years: 21.00  . Smokeless tobacco: Never Used  Vaping Use  . Vaping Use: Never used  Substance and Sexual Activity  . Alcohol use: No  .  Drug use: No  . Sexual activity: Yes    Partners: Female  Other Topics Concern  . Not on file  Social History Narrative   Lives with wife in one story home      Highest level of education is highschool         Left handed; Caffeine - 1 cup coffee am/ 2 sodas a day;      Exercise - walk at work      Social Determinants of Corporate investment banker Strain:   . Difficulty of Paying Living Expenses:   Food Insecurity:   . Worried About Programme researcher, broadcasting/film/video in the Last Year:   . Barista in the Last Year:   Transportation Needs:   . Automotive engineer (Medical):   Marland Kitchen Lack of Transportation (Non-Medical):   Physical Activity:   . Days of Exercise per Week:   . Minutes of Exercise per Session:   Stress:   . Feeling of Stress :   Social Connections:   . Frequency of Communication with Friends and Family:   . Frequency of Social Gatherings with Friends and Family:   . Attends Religious Services:   . Active Member of Clubs or Organizations:   . Attends Banker Meetings:   Marland Kitchen Marital Status:   Intimate Partner Violence:   . Fear of Current or Ex-Partner:   . Emotionally Abused:   Marland Kitchen Physically Abused:   . Sexually Abused:     PHYSICAL EXAM: *** General: No acute distress.  Patient appears well-groomed.   Head:  Normocephalic/atraumatic Eyes:  Fundi examined but not visualized Neck: supple, no paraspinal tenderness, full range of motion Heart:  Regular rate and rhythm Lungs:  Clear to auscultation bilaterally Back: No paraspinal tenderness Neurological Exam: alert and oriented to person, place, and time. Attention span and concentration intact, recent and remote memory intact, fund of knowledge intact.  Speech fluent and not dysarthric, language intact.  CN II-XII intact. Bulk and tone normal, muscle strength 5/5 throughout.  Sensation to light touch, temperature and vibration intact.  Deep tendon reflexes 2+ throughout, toes downgoing.  Finger to nose and heel to shin testing intact.  Gait normal, Romberg negative.  IMPRESSION: Right-sided trigeminal neuralgia  PLAN: ***  Shon Millet, DO  CC: Brent Bulla, MD

## 2020-05-02 ENCOUNTER — Ambulatory Visit: Payer: BC Managed Care – PPO | Admitting: Neurology

## 2020-10-30 ENCOUNTER — Other Ambulatory Visit: Payer: Self-pay | Admitting: Legal Medicine

## 2020-11-07 ENCOUNTER — Other Ambulatory Visit: Payer: Self-pay | Admitting: Legal Medicine

## 2021-01-06 ENCOUNTER — Encounter: Payer: Self-pay | Admitting: Gastroenterology

## 2021-01-09 ENCOUNTER — Other Ambulatory Visit: Payer: Self-pay | Admitting: Legal Medicine

## 2021-01-09 ENCOUNTER — Encounter: Payer: Self-pay | Admitting: Legal Medicine

## 2021-01-09 ENCOUNTER — Ambulatory Visit: Payer: BC Managed Care – PPO | Admitting: Legal Medicine

## 2021-01-09 ENCOUNTER — Other Ambulatory Visit: Payer: Self-pay

## 2021-01-09 VITALS — BP 142/84 | HR 72 | Temp 97.7°F | Resp 16 | Ht 73.0 in | Wt 196.0 lb

## 2021-01-09 DIAGNOSIS — G500 Trigeminal neuralgia: Secondary | ICD-10-CM | POA: Diagnosis not present

## 2021-01-09 DIAGNOSIS — M654 Radial styloid tenosynovitis [de Quervain]: Secondary | ICD-10-CM

## 2021-01-09 DIAGNOSIS — R519 Headache, unspecified: Secondary | ICD-10-CM | POA: Insufficient documentation

## 2021-01-09 MED ORDER — PREDNISONE 10 MG (21) PO TBPK: ORAL_TABLET | ORAL | 0 refills | Status: DC

## 2021-01-09 MED ORDER — NURTEC 75 MG PO TBDP: 1.0000 | ORAL_TABLET | Freq: Once | ORAL | 3 refills | Status: DC | PRN

## 2021-01-09 NOTE — Progress Notes (Signed)
Subjective:  Patient ID: Jeff Acevedo, male    DOB: 1960/11/22  Age: 60 y.o. MRN: [id number]  Chief Complaint  Patient presents with  . Headache  . Wrist Pain    Right wrist pain     HPI: right wrist pain since easter.  No deficit injury. Pain gripping.  Possible trigeminal neuralgia right maxillary area , telephone consult only.  meds did not helping. He gets blurred vision, throbbing headache.he has aura.   Current Outpatient Medications on File Prior to Visit  Medication Sig Dispense Refill  . captopril (CAPOTEN) 100 MG tablet TAKE 1 TABLET BY MOUTH EVERY DAY 90 tablet 2  . hydrochlorothiazide (HYDRODIURIL) 25 MG tablet TAKE 1 TABLET BY MOUTH EVERY MORNING 90 tablet 2  . Ibuprofen 200 MG CAPS Take 2 capsules (400 mg total) by mouth every 8 (eight) hours as needed (headache/fever).  0  . rosuvastatin (CRESTOR) 40 MG tablet TAKE 1 TABLET BY MOUTH EVERY DAY 30 tablet 6  . verapamil (VERELAN PM) 180 MG 24 hr capsule TAKE 1 CAPSULE BY MOUTH TWICE A DAY (Patient taking differently: Take 180 mg by mouth daily in the afternoon.) 180 capsule 2   No current facility-administered medications on file prior to visit.   Past Medical History:  Diagnosis Date  . Cluster headache   . HTN (hypertension)   . Hypotension    Past Surgical History:  Procedure Laterality Date  . CARPAL TUNNEL RELEASE      Family History  Problem Relation Age of Onset  . Heart failure Other   . Heart failure Father    Social History   Socioeconomic History  . Marital status: Married    Spouse name: Not on file  . Number of children: 2  . Years of education: Not on file  . Highest education level: Not on file  Occupational History  . Occupation: lift & build furnitu  Tobacco Use  . Smoking status: Current Every Day Smoker    Packs/day: 0.25    Years: 21.00    Pack years: 5.25  . Smokeless tobacco: Never Used  Vaping Use  . Vaping Use: Never used  Substance and Sexual Activity  . Alcohol use:  No  . Drug use: No  . Sexual activity: Yes    Partners: Female  Other Topics Concern  . Not on file  Social History Narrative   Lives with wife in one story home      Highest level of education is highschool         Left handed; Caffeine - 1 cup coffee am/ 2 sodas a day;      Exercise - walk at work      Social Determinants of Corporate investment banker Strain: Not on file  Food Insecurity: Not on file  Transportation Needs: Not on file  Physical Activity: Not on file  Stress: Not on file  Social Connections: Not on file    Review of Systems  Constitutional: Negative for activity change and appetite change.  HENT: Negative.   Respiratory: Negative for chest tightness and shortness of breath.   Gastrointestinal: Negative for abdominal distention and abdominal pain.  Genitourinary: Negative for difficulty urinating and dysuria.  Musculoskeletal: Positive for arthralgias (right wrist).  Neurological: Negative.   Psychiatric/Behavioral: Negative.      Objective:  BP (!) 142/84   Pulse 72   Temp 97.7 F (36.5 C)   Resp 16   Ht 6\' 1"  (1.854 m)   Wt  196 lb (88.9 kg)   SpO2 98%   BMI 25.86 kg/m   BP/Weight 01/09/2021 11/12/2019 06/21/2019  Systolic BP 142 156 -  Diastolic BP 84 95 -  Wt. (Lbs) 196 - 205  BMI 25.86 - 27.05    Physical Exam Vitals reviewed.  Constitutional:      Appearance: He is well-developed.  HENT:     Head: Normocephalic and atraumatic.     Mouth/Throat:     Mouth: Mucous membranes are moist.  Eyes:     General: No visual field deficit.    Extraocular Movements:     Right eye: Normal extraocular motion and no nystagmus.     Left eye: Normal extraocular motion and no nystagmus.     Pupils: Pupils are equal, round, and reactive to light.     Right eye: Pupil is round and reactive.     Left eye: Pupil is round and reactive.  Cardiovascular:     Rate and Rhythm: Normal rate and regular rhythm.     Heart sounds: Normal heart sounds. No  gallop.   Pulmonary:     Effort: Pulmonary effort is normal.     Breath sounds: Normal breath sounds.  Abdominal:     Palpations: Abdomen is soft.  Musculoskeletal:        General: Normal range of motion.     Cervical back: Normal range of motion and neck supple.     Comments: Pain right wrist with positive Dequervains sign, he is using a wrist splint.  Skin:    General: Skin is warm.  Neurological:     Mental Status: He is alert.     Cranial Nerves: No cranial nerve deficit, dysarthria or facial asymmetry.     Sensory: No sensory deficit.     Motor: No weakness.     Coordination: Romberg sign negative. Coordination normal.     Gait: Gait normal.     Deep Tendon Reflexes: Reflexes normal.       Lab Results  Component Value Date   WBC 7.9 01/04/2016   HGB 13.2 01/04/2016   HCT 39.3 01/04/2016   PLT 198 01/04/2016   GLUCOSE 115 (H) 01/04/2016   ALT 14 (L) 01/03/2016   AST 27 01/03/2016   NA 138 01/04/2016   K 3.7 01/04/2016   CL 106 01/04/2016   CREATININE 0.83 01/04/2016   BUN 15 01/04/2016   CO2 23 01/04/2016      Assessment & Plan:   Diagnoses and all orders for this visit: Trigeminal neuralgia of right side of face -     Rimegepant Sulfate (NURTEC) 75 MG TBDP; Take 1 tablet by mouth once as needed for up to 1 dose (headache). Patient has been having worsening headaches he was seen in the past and had telemedicine visits by at [facility] area of therapy and neurologist who felt he was having maxillary trigeminal neuralgia.  None of the medicines tried have helped.  He does notice he does have an aura and see some lights before these happen suggesting it possibly may be migrainous in nature.  I will try him on Nurtec samples to see if this helps better. Worsening headaches Patient is having worsening headaches as noted above. Tenosynovitis, de Quervain -     predniSONE (STERAPRED UNI-PAK 21 TAB) 10 MG (21) TBPK tablet; Take 6ills first day , then 5 pills day 2  and then cut down one pill day until gone Patient has dequervain tenosynovitis in the right hand.  He has a positive Finkelstein's test he has full range of motion of his wrist and has been using a night brace.  Patient does not want to try injection into the synovial area but is willing to try a steroid pack for a week and use his brace we will follow back in 2 weeks.        Follow-up: Return in about 2 weeks (around 01/23/2021).  An After Visit Summary was printed and given to the patient.  Brent Bulla, MD [facility] [phone]

## 2021-01-23 ENCOUNTER — Ambulatory Visit: Payer: BC Managed Care – PPO | Admitting: Legal Medicine

## 2021-01-23 ENCOUNTER — Encounter: Payer: Self-pay | Admitting: Legal Medicine

## 2021-01-23 ENCOUNTER — Other Ambulatory Visit: Payer: Self-pay

## 2021-01-23 VITALS — BP 160/90 | HR 90 | Temp 98.1°F | Resp 16 | Ht 73.0 in | Wt 196.0 lb

## 2021-01-23 DIAGNOSIS — G500 Trigeminal neuralgia: Secondary | ICD-10-CM

## 2021-01-23 DIAGNOSIS — M654 Radial styloid tenosynovitis [de Quervain]: Secondary | ICD-10-CM | POA: Diagnosis not present

## 2021-01-23 DIAGNOSIS — I10 Essential (primary) hypertension: Secondary | ICD-10-CM | POA: Diagnosis not present

## 2021-01-23 MED ORDER — HYDRALAZINE HCL 25 MG PO TABS: 25.0000 mg | ORAL_TABLET | Freq: Three times a day (TID) | ORAL | 2 refills | Status: DC

## 2021-01-23 NOTE — Progress Notes (Signed)
Subjective:  Patient ID: Jeff Acevedo, male    DOB: 1961-05-23  Age: 60 y.o. MRN: [id number]  Chief Complaint  Patient presents with  . Wrist Pain    Right wrist pain    HPI: chronic visit  Headaches worse with weather change.  He has been on amitriptyline.  He has trigeminal neuralgia .  Patient also has a diagnosis of other types of headache by Dr. Everlena Cooper has been having more frequent headaches and Nurtec is is helping but he gets sometimes several day he also is getting headaches at night that wake him from sleep is sometimes nauseated has mild right eye lacrimation.  BP remains elevated despite adding the verapamil has not helped his blood pressure nor his headaches so we will stop it and start him on hydralazine 25 mg 3 times a day.  His daughter is present.   Current Outpatient Medications on File Prior to Visit  Medication Sig Dispense Refill  . captopril (CAPOTEN) 100 MG tablet TAKE 1 TABLET BY MOUTH EVERY DAY 90 tablet 2  . hydrochlorothiazide (HYDRODIURIL) 25 MG tablet TAKE 1 TABLET BY MOUTH EVERY MORNING 90 tablet 2  . Ibuprofen 200 MG CAPS Take 2 capsules (400 mg total) by mouth every 8 (eight) hours as needed (headache/fever).  0  . NURTEC 75 MG TBDP TAKE 1 TABLET BY MOUTH ONCE AS NEEDED FOR UP TO 1 DOSE (HEADACHE). 30 tablet 3  . rosuvastatin (CRESTOR) 40 MG tablet TAKE 1 TABLET BY MOUTH EVERY DAY 30 tablet 6   No current facility-administered medications on file prior to visit.   Past Medical History:  Diagnosis Date  . Cluster headache    Past Surgical History:  Procedure Laterality Date  . CARPAL TUNNEL RELEASE      Family History  Problem Relation Age of Onset  . Heart failure Other   . Heart failure Father    Social History   Socioeconomic History  . Marital status: Married    Spouse name: Not on file  . Number of children: 2  . Years of education: Not on file  . Highest education level: Not on file  Occupational History  . Occupation: lift &  build furnitu  Tobacco Use  . Smoking status: Current Every Day Smoker    Packs/day: 0.25    Years: 21.00    Pack years: 5.25  . Smokeless tobacco: Never Used  Vaping Use  . Vaping Use: Never used  Substance and Sexual Activity  . Alcohol use: No  . Drug use: No  . Sexual activity: Yes    Partners: Female  Other Topics Concern  . Not on file  Social History Narrative   Lives with wife in one story home      Highest level of education is highschool         Left handed; Caffeine - 1 cup coffee am/ 2 sodas a day;      Exercise - walk at work      Social Determinants of Corporate investment banker Strain: Not on file  Food Insecurity: Not on file  Transportation Needs: Not on file  Physical Activity: Not on file  Stress: Not on file  Social Connections: Not on file    Review of Systems  Constitutional: Negative for activity change and appetite change.  HENT: Negative for congestion.   Eyes: Negative for visual disturbance.  Respiratory: Negative for chest tightness and shortness of breath.   Cardiovascular: Negative for chest pain, palpitations  and leg swelling.  Gastrointestinal: Negative for abdominal distention and abdominal pain.  Genitourinary: Negative for difficulty urinating, dysuria and urgency.  Musculoskeletal: Negative for arthralgias and back pain.  Skin: Negative.   Neurological: Positive for headaches (more frequent headaches).  Psychiatric/Behavioral: Negative.      Objective:  BP (!) 160/90   Pulse 90   Temp 98.1 F (36.7 C)   Resp 16   Ht 6\' 1"  (1.854 m)   Wt 196 lb (88.9 kg)   SpO2 97%   BMI 25.86 kg/m   BP/Weight 01/23/2021 01/09/2021 11/12/2019  Systolic BP 160 142 156  Diastolic BP 90 84 95  Wt. (Lbs) 196 196 -  BMI 25.86 25.86 -    Physical Exam Vitals reviewed.  Constitutional:      Appearance: Normal appearance.  HENT:     Head: Normocephalic.     Right Ear: Tympanic membrane, ear canal and external ear normal.     Left  Ear: Tympanic membrane, ear canal and external ear normal.     Mouth/Throat:     Mouth: Mucous membranes are moist.     Pharynx: Oropharynx is clear.  Eyes:     Extraocular Movements: Extraocular movements intact.     Conjunctiva/sclera: Conjunctivae normal.     Pupils: Pupils are equal, round, and reactive to light.  Cardiovascular:     Rate and Rhythm: Normal rate and regular rhythm.     Pulses: Normal pulses.     Heart sounds: Normal heart sounds. No murmur heard. No gallop.   Pulmonary:     Effort: Pulmonary effort is normal. No respiratory distress.     Breath sounds: Normal breath sounds. No rales.  Abdominal:     General: Abdomen is flat. Bowel sounds are normal.     Palpations: Abdomen is soft.  Musculoskeletal:        General: Tenderness present.     Cervical back: Normal range of motion and neck supple.     Comments: Pain over extensor longus right hand and positive finkelsteins test.  Skin:    General: Skin is warm.  Neurological:     General: No focal deficit present.     Mental Status: He is alert and oriented to person, place, and time. Mental status is at baseline.       Lab Results  Component Value Date   WBC 7.9 01/04/2016   HGB 13.2 01/04/2016   HCT 39.3 01/04/2016   PLT 198 01/04/2016   GLUCOSE 115 (H) 01/04/2016   ALT 14 (L) 01/03/2016   AST 27 01/03/2016   NA 138 01/04/2016   K 3.7 01/04/2016   CL 106 01/04/2016   CREATININE 0.83 01/04/2016   BUN 15 01/04/2016   CO2 23 01/04/2016      Assessment & Plan:   Diagnoses and all orders for this visit: Trigeminal neuralgia of right side of face -     Ambulatory referral to Neurology -     CT Head Wo Contrast Patient continues to have headaches and at times up to 3 and a day he seems to be worse with changes in weather the verapamil and Nurtec have not helped much.  We will get a CT scan since he is having more severe headaches and they occur at night waking him up sometimes associated with  nausea.  We will refer him to [facility] neurologic for further work-up and I gave him 3 samples of Ubrelvy Tenosynovitis, de Quervain Patient has Decker veins tenosynovitis and after  informed consent I prepped the area right forearm and injected half cc of Xylocaine and 1 cc of Kenalog into the tendon sheath no complications.  He will continue using his brace at night.  HTN (hypertension), benign -     hydrALAZINE (APRESOLINE) 25 MG tablet; Take 1 tablet (25 mg total) by mouth 3 (three) times daily. Patient continues to have high blood pressure so I will stop the verapamil since it did not help we will start him on Apresoline 25 mg 3 times a day follow-up in 2 weeks.       Follow-up: Return in about 2 weeks (around 02/06/2021) for hypertension.  An After Visit Summary was printed and given to the patient.  Brent Bulla, MD [facility] [phone]

## 2021-01-23 NOTE — Patient Instructions (Signed)
Stop verapamil Start haydalazine

## 2021-01-24 ENCOUNTER — Telehealth: Payer: Self-pay | Admitting: Legal Medicine

## 2021-01-24 NOTE — Telephone Encounter (Signed)
   Jeff Acevedo has been scheduled for the following appointment:  WHAT: Head CT WHERE: [facility] DATE: 01/26/21 TIME: 11:00  Patient has been made aware. Spoke to patient's wife.

## 2021-01-25 ENCOUNTER — Encounter (HOSPITAL_COMMUNITY): Payer: Self-pay | Admitting: Emergency Medicine

## 2021-01-25 ENCOUNTER — Ambulatory Visit: Payer: BC Managed Care – PPO | Admitting: Neurology

## 2021-01-25 ENCOUNTER — Encounter: Payer: Self-pay | Admitting: Neurology

## 2021-01-25 ENCOUNTER — Emergency Department (HOSPITAL_COMMUNITY)
Admission: EM | Admit: 2021-01-25 | Discharge: 2021-01-25 | Disposition: A | Discharge status: Home / Self Care | Payer: BC Managed Care – PPO | Attending: Emergency Medicine | Admitting: Emergency Medicine

## 2021-01-25 ENCOUNTER — Emergency Department (HOSPITAL_COMMUNITY): Payer: BC Managed Care – PPO

## 2021-01-25 ENCOUNTER — Other Ambulatory Visit: Payer: Self-pay

## 2021-01-25 VITALS — BP 185/98 | HR 99 | Resp 20 | Ht 72.0 in | Wt 196.0 lb

## 2021-01-25 DIAGNOSIS — R519 Headache, unspecified: Secondary | ICD-10-CM | POA: Insufficient documentation

## 2021-01-25 DIAGNOSIS — D72829 Elevated white blood cell count, unspecified: Secondary | ICD-10-CM | POA: Insufficient documentation

## 2021-01-25 DIAGNOSIS — I10 Essential (primary) hypertension: Secondary | ICD-10-CM | POA: Diagnosis not present

## 2021-01-25 DIAGNOSIS — Z79899 Other long term (current) drug therapy: Secondary | ICD-10-CM | POA: Insufficient documentation

## 2021-01-25 DIAGNOSIS — G44021 Chronic cluster headache, intractable: Secondary | ICD-10-CM

## 2021-01-25 DIAGNOSIS — F17200 Nicotine dependence, unspecified, uncomplicated: Secondary | ICD-10-CM | POA: Insufficient documentation

## 2021-01-25 DIAGNOSIS — R030 Elevated blood-pressure reading, without diagnosis of hypertension: Secondary | ICD-10-CM | POA: Diagnosis not present

## 2021-01-25 HISTORY — DX: Essential (primary) hypertension: I10

## 2021-01-25 LAB — BASIC METABOLIC PANEL
Anion gap: 4 — ABNORMAL LOW (ref 5–15)
BUN: 14 mg/dL (ref 8–23)
CO2: 30 mmol/L (ref 22–32)
Calcium: 9.2 mg/dL (ref 8.9–10.3)
Chloride: 107 mmol/L (ref 98–111)
Creatinine, Ser: 1.08 mg/dL (ref 0.61–1.24)
GFR, Estimated: >60 mL/min (ref >60)
Glucose, Bld: 93 mg/dL (ref 70–99)
Potassium: 4 mmol/L (ref 3.5–5.1)
Sodium: 141 mmol/L (ref 135–145)

## 2021-01-25 LAB — CBC WITH DIFFERENTIAL/PLATELET
Abs Immature Granulocytes: 0.04 10*3/uL (ref 0.00–0.07)
Basophils Absolute: 0 10*3/uL (ref 0.0–0.1)
Basophils Relative: 0 %
Eosinophils Absolute: 0.1 10*3/uL (ref 0.0–0.5)
Eosinophils Relative: 1 %
HCT: 48.8 % (ref 39.0–52.0)
Hemoglobin: 15.7 g/dL (ref 13.0–17.0)
Immature Granulocytes: 0 %
Lymphocytes Relative: 24 %
Lymphs Abs: 2.7 10*3/uL (ref 0.7–4.0)
MCH: 29.9 pg (ref 26.0–34.0)
MCHC: 32.2 g/dL (ref 30.0–36.0)
MCV: 93 fL (ref 80.0–100.0)
Monocytes Absolute: 0.6 10*3/uL (ref 0.1–1.0)
Monocytes Relative: 5 %
Neutro Abs: 8 10*3/uL — ABNORMAL HIGH (ref 1.7–7.7)
Neutrophils Relative %: 70 %
Platelets: 162 10*3/uL (ref 150–400)
RBC: 5.25 MIL/uL (ref 4.22–5.81)
RDW: 13.2 % (ref 11.5–15.5)
WBC: 11.5 10*3/uL — ABNORMAL HIGH (ref 4.0–10.5)
nRBC: 0 % (ref 0.0–0.2)

## 2021-01-25 LAB — TROPONIN I (HIGH SENSITIVITY): Troponin I (High Sensitivity): 13 ng/L (ref <18)

## 2021-01-25 NOTE — Patient Instructions (Signed)
1.  Start Trokendi XR - take 50mg  daily for one week, then increase to 100mg  daily.  If no improvement in 8 weeks, contact me and we can increase dose 2.  At earliest onset of headache, use oxygen 10-15L/min for 15-20 minutes 3.  At earliest onset of headache, take Elyxyb once.  If effective, contact me for prescription 4.  Follow up 4 months

## 2021-01-25 NOTE — ED Triage Notes (Signed)
Pt states he has had trouble keeping his BP down. Pt states he is in the process of having his bp meds changed. Pt has been having cluster headaches and went to neurologist today. Pt's neurologist sent him here due to BP being elevated.

## 2021-01-25 NOTE — Progress Notes (Signed)
NEUROLOGY FOLLOW UP OFFICE NOTE  LEALAND ELTING [id number]  Assessment/Plan:   1.  Headaches now seem more consistent with chronic cluster headaches - unfortunately, it does not seem to be responding to verapamil.   2.  Hypertension  1.  Will start Trokendi XR 50mg  daily for a week, then increase to 100mg  at bedtime 2.  For abortive therapy, will prescribe O2.  I have given him a sample of Elyxyb.  We may need to consider Lidocaine NS as well.  Due to age, I would favor avoiding triptans. 3.  Advised to contact PCP's office for further instruction regarding blood pressure. 4.  Follow up 4 months.  Subjective:  Mayford Alberg is a61year old male with hypertension, cluster headacheand history of viral meningitis in 2017 whofollows up for right sided trigeminal neuralgia.  UPDATE: verapamil CR 360mg ,  hydralazine, Nurtec, ibuprofen  Last seen in February 2021.  In March, he had increased attacks and was started on oxcarbazepine 150mg  twice daily but it caused drowsiness.  It resolved but they returned in the Fall, but they were not severe or frequent.  In winter, they became worse, particularly around change in weather.  Since February, they have progressively become more severe and frequent.  He describes a warm sensation at the right nasolabial fold that progresses to severe throbbing pain around the right eye and maxilla.  He reports associated right eye lacrimation and conjunctival injection.  Over the past week, they have been daily.  They tend to occur between 11 AM and 12 PM.  But they also can wake him up from sleep.  He has been dealing with uncontrolled hypertension and is supposed to be discontinuing verapamil and starting another antihypertensive.    He has an upcoming CT head.   HISTORY: Since 2018, he has had severe right sided facial pain from right nostril down to the jaw. Initially, he had a tooth extraction but pain never resolved. He was then treated for  sinus infection. X-ray of sinuses were negative. It is a severe shooting paroxysmal pain, lasting 10 to 15 minutes but other times 45-60 minutes. It occurs every couple of day for several months. In March to April 2020, it occurred daily, often waking him up at night. No associated nausea, conjunctival injection, ptosis, but noted right sided facial redness and nasal congestion. No specific triggers such as talking, eating or brushing teeth. It would occur spontaneously and abort spontaneously. He initially responded well to amitriptyline.  He has a past history of cluster headaches, which were left-sided. They were controlled on verapamil.  He has history of viral meningitis in 2017.  Past medications:  Amitriptyline, oxcarbazepine, Flexeril, topiramate  PAST MEDICAL HISTORY: Past Medical History:  Diagnosis Date  . Cluster headache     MEDICATIONS: Current Outpatient Medications on File Prior to Visit  Medication Sig Dispense Refill  . captopril (CAPOTEN) 100 MG tablet TAKE 1 TABLET BY MOUTH EVERY DAY 90 tablet 2  . hydrALAZINE (APRESOLINE) 25 MG tablet Take 1 tablet (25 mg total) by mouth 3 (three) times daily. 90 tablet 2  . hydrochlorothiazide (HYDRODIURIL) 25 MG tablet TAKE 1 TABLET BY MOUTH EVERY MORNING 90 tablet 2  . Ibuprofen 200 MG CAPS Take 2 capsules (400 mg total) by mouth every 8 (eight) hours as needed (headache/fever).  0  . NURTEC 75 MG TBDP TAKE 1 TABLET BY MOUTH ONCE AS NEEDED FOR UP TO 1 DOSE (HEADACHE). 30 tablet 3  . rosuvastatin (CRESTOR) 40 MG tablet  TAKE 1 TABLET BY MOUTH EVERY DAY 30 tablet 6   No current facility-administered medications on file prior to visit.    ALLERGIES: No Known Allergies  FAMILY HISTORY: Family History  Problem Relation Age of Onset  . Heart failure Other   . Heart failure Father       Objective:  Blood pressure (!) 185/98, pulse 99, resp. rate 20, height 6' (1.829 m), weight 196 lb (88.9 kg), SpO2 100 %. General: No  acute distress.  Patient appears well-groomed.   Head:  Normocephalic/atraumatic Eyes:  Fundi examined but not visualized Neck: supple, no paraspinal tenderness, full range of motion Heart:  Regular rate and rhythm Lungs:  Clear to auscultation bilaterally Back: No paraspinal tenderness Neurological Exam: alert and oriented to person, place, and time. Speech fluent and not dysarthric, language intact.  CN II-XII intact. Bulk and tone normal, muscle strength 5/5 throughout.  Sensation to light touch  intact.  Deep tendon reflexes 2+ throughout.  Finger to nose testing intact.  Gait normal, Romberg negative.   Shon Millet, DO  CC: Abigail Miyamoto, MD

## 2021-01-25 NOTE — Discharge Instructions (Signed)
As we discussed, it is very important for you to pick up the medication that was prescribed by Dr. Marina Goodell and started to manage your blood pressure.  Please follow-up with both Dr. Marina Goodell as well as your neurologist.  Return to the emergency department for any headache, difficulty breathing, chest pain, numbness/weakness of your arms or legs, vomiting, vision changes.

## 2021-01-25 NOTE — ED Provider Notes (Signed)
[facility] EMERGENCY DEPARTMENT Provider Note   CSN: [id number] Arrival date & time: 01/25/21  1141     History Chief Complaint  Patient presents with  . Hypertension    Jeff Acevedo is a 60 y.o. male with PMH/o HTN, Cluster HA who presents for evaluation of HTN.  Patient reports he has a history of hypertension that has been difficult to control.  He has been working with his primary care doctor to control his blood pressure.  He also reports a history of cluster headaches and states that they have been getting worse over the last 2 years.  He saw his neurologist today for evaluation of headaches and he was noted to be hypertensive.  They called his PCP who advised that he come to the emergency department for further evaluation.  Patient reports that he was recently discontinued off verapamil and was started on hydralazine but states that he has not picked up the prescription yet.  He also takes hydrochlorothiazide which she states he has taken.  He states that his blood pressure has been high for several months and they have been working to get it controlled better.  He reports that he has had daily headaches but currently denies any headache at this time.  He states that the neurologist is trying new medication to see if they can control his headache.  Patient states that at this time, he does not have any symptoms.  He denies any headache, vision changes, chest pain, difficulty breathing, abdominal pain, nausea/vomiting, numbness/weakness of his arms or legs.  The history is provided by the patient.       Past Medical History:  Diagnosis Date  . Cluster headache   . Hypertension     Patient Active Problem List   Diagnosis Date Noted  . Worsening headaches 01/09/2021  . Exposure to SARS-associated coronavirus 11/10/2019  . Meningitis, viral 01/05/2016  . HTN (hypertension), benign 01/03/2016    Past Surgical History:  Procedure Laterality Date  . CARPAL  TUNNEL RELEASE         Family History  Problem Relation Age of Onset  . Heart failure Other   . Heart failure Father     Social History   Tobacco Use  . Smoking status: Current Every Day Smoker    Packs/day: 0.25    Years: 21.00    Pack years: 5.25  . Smokeless tobacco: Never Used  Vaping Use  . Vaping Use: Never used  Substance Use Topics  . Alcohol use: No  . Drug use: No    Home Medications Prior to Admission medications   Medication Sig Start Date End Date Taking? Authorizing Provider  captopril (CAPOTEN) 100 MG tablet TAKE 1 TABLET BY MOUTH EVERY DAY 11/07/20   Abigail Miyamoto, MD  hydrALAZINE (APRESOLINE) 25 MG tablet Take 1 tablet (25 mg total) by mouth 3 (three) times daily. 01/23/21   Abigail Miyamoto, MD  hydrochlorothiazide (HYDRODIURIL) 25 MG tablet TAKE 1 TABLET BY MOUTH EVERY MORNING 04/12/20   Abigail Miyamoto, MD  Ibuprofen 200 MG CAPS Take 2 capsules (400 mg total) by mouth every 8 (eight) hours as needed (headache/fever). 01/05/16   Zannie Cove, MD  NURTEC 75 MG TBDP TAKE 1 TABLET BY MOUTH ONCE AS NEEDED FOR UP TO 1 DOSE (HEADACHE). 01/10/21   Abigail Miyamoto, MD  rosuvastatin (CRESTOR) 40 MG tablet TAKE 1 TABLET BY MOUTH EVERY DAY 10/30/20   Abigail Miyamoto, MD    Allergies  Patient has no known allergies.  Review of Systems   Review of Systems  Constitutional: Negative for fever.  Respiratory: Negative for shortness of breath.   Cardiovascular: Negative for chest pain.  Gastrointestinal: Negative for abdominal pain, nausea and vomiting.  Neurological: Negative for weakness, numbness and headaches.  All other systems reviewed and are negative.   Physical Exam Updated Vital Signs BP (!) 170/90 (BP Location: Right Arm)   Pulse 60   Temp 98.2 F (36.8 C) (Oral)   Resp 16   Ht 6\' 1"  (1.854 m)   Wt 88.9 kg   SpO2 100%   BMI 25.86 kg/m   Physical Exam Vitals and nursing note reviewed.  Constitutional:       Appearance: Normal appearance. He is well-developed.  HENT:     Head: Normocephalic and atraumatic.  Eyes:     General: Lids are normal.     Conjunctiva/sclera: Conjunctivae normal.     Pupils: Pupils are equal, round, and reactive to light.     Comments: PERRL. EOMs intact. No nystagmus. No neglect.   Cardiovascular:     Rate and Rhythm: Normal rate and regular rhythm.     Pulses: Normal pulses.          Radial pulses are 2+ on the right side and 2+ on the left side.     Heart sounds: Normal heart sounds. No murmur heard. No friction rub. No gallop.   Pulmonary:     Effort: Pulmonary effort is normal.     Breath sounds: Normal breath sounds.     Comments: Lungs clear to auscultation bilaterally.  Symmetric chest rise.  No wheezing, rales, rhonchi. Abdominal:     Palpations: Abdomen is soft. Abdomen is not rigid.     Tenderness: There is no abdominal tenderness. There is no guarding.     Comments: Abdomen is soft, non-distended, non-tender. No rigidity, No guarding. No peritoneal signs.  Musculoskeletal:        General: Normal range of motion.     Cervical back: Full passive range of motion without pain.  Skin:    General: Skin is warm and dry.     Capillary Refill: Capillary refill takes less than 2 seconds.  Neurological:     Mental Status: He is alert and oriented to person, place, and time.     Comments: Cranial nerves III-XII intact Follows commands, Moves all extremities  5/5 strength to BUE and BLE  Sensation intact throughout all major nerve distributions. No gait abnormalities  No slurred speech. No facial droop.   Psychiatric:        Speech: Speech normal.     ED Results / Procedures / Treatments   Labs (all labs ordered are listed, but only abnormal results are displayed) Labs Reviewed  CBC WITH DIFFERENTIAL/PLATELET - Abnormal; Notable for the following components:      Result Value   WBC 11.5 (*)    Neutro Abs 8.0 (*)    All other components within normal  limits  BASIC METABOLIC PANEL - Abnormal; Notable for the following components:   Anion gap 4 (*)    All other components within normal limits  TROPONIN I (HIGH SENSITIVITY)    EKG EKG Interpretation  Date/Time:  Thursday Jan 25 2021 12:21:47 EDT Ventricular Rate:  67 PR Interval:  156 QRS Duration: 86 QT Interval:  400 QTC Calculation: 422 R Axis:   77 Text Interpretation: Normal sinus rhythm ST and T wave changes consistent with LVH No  CP or anginal equivalent Abnormal ECG No old tracing for comparison Confirmed by Alona Bene [phone]) on 01/25/2021 4:37:40 PM   Radiology DG Chest 2 View  Result Date: 01/25/2021 CLINICAL DATA:  Hypertension EXAM: CHEST - 2 VIEW COMPARISON:  01/02/2016 FINDINGS: Normal heart size, mediastinal contours, and pulmonary vascularity. Lungs clear. No pleural effusion or pneumothorax. Scattered endplate spur formation. IMPRESSION: No acute abnormalities. Electronically Signed   By: Ulyses Southward M.D.   On: 01/25/2021 13:15   CT Head Wo Contrast  Result Date: 01/25/2021 CLINICAL DATA:  Headache. EXAM: CT HEAD WITHOUT CONTRAST TECHNIQUE: Contiguous axial images were obtained from the base of the skull through the vertex without intravenous contrast. COMPARISON:  01/03/2016 FINDINGS: Brain: There is no evidence for acute hemorrhage, hydrocephalus, mass lesion, or abnormal extra-axial fluid collection. No definite CT evidence for acute infarction. Vascular: No hyperdense vessel or unexpected calcification. Skull: No evidence for fracture. No worrisome lytic or sclerotic lesion. Sinuses/Orbits: The visualized paranasal sinuses and mastoid air cells are clear. Visualized portions of the globes and intraorbital fat are unremarkable. Other: None. IMPRESSION: Unremarkable CT evaluation of the brain. Electronically Signed   By: Kennith Center M.D.   On: 01/25/2021 14:13    Procedures Procedures   Medications Ordered in ED Medications - No data to display  ED Course   I have reviewed the triage vital signs and the nursing notes.  Pertinent labs & imaging results that were available during my care of the patient were reviewed by me and considered in my medical decision making (see chart for details).    MDM Rules/Calculators/A&P                          60 year old male past ministry of hypertension, cluster headaches sent by neurology and PCP for evaluation of hypertension.  Was at his neurology office earlier today for evaluation of his headaches that have been getting worse over the last few years.  Was noted to be hypertensive at their office was sent to the ED for further evaluation.  He does a history of uncontrolled hypertension and states he has been working with his primary care doctor to try and help it.  He was recently discontinued on verapamil and is supposed to start hydralazine but he has not picked up the prescription yet.  He currently denies any symptoms.  No headache, chest pain, difficulty breathing, numbness/weakness of his arms or legs.  On initially arrival, he is afebrile, nontoxic-appearing.  He was initially hypertensive with systolic blood pressure in the 200s.  His repeat blood pressure on my evaluation is 171/93 without any intervention. Vitals otherwise stable..  On exam, he has no neurological deficits.  Exam is unremarkable.  History/physical exam not consistent with hypertensive emergency.  Additionally, do not suspect CVA, ACS etiology.  Patient with known history of uncontrolled hypertension and has had recent medications added to his regimen to try and combat that but has not started them yet.  Suspect this is what is contributing as well as his headaches.  Labs, imaging ordered at triage.  Seen by [facility] today for evaluation of HA. Thought HA were due to cluster HA. Had started him on verapamil but there was some question of whether his PCP was going to change him to another med for his HTN.   Trop is negative. CBC shows  slight leukocytosis of 11.5. Hgb stable at 15.7. BMP shows normal BUN/Cr.   CXR is normal. CT  head shows no acute abnormalities.  Work-up today shows no signs of endorgan damage.  I discussed results with patient.  He is post to have a CT scan next week.  I discussed with him regarding calling his primary care doctor and informing him of today's CT scan.  At this time, patient is well-appearing.  Blood pressure improved without any intervention.  Patient encouraged to pick up his hydralazine and start it.  Instructed patient to follow-up with his primary care doctor as well as neuro. At this time, patient exhibits no emergent life-threatening condition that require further evaluation in ED. Discussed patient with Dr. Jacqulyn Bath who is agreeable to plan. Patient had ample opportunity for questions and discussion. All patient's questions were answered with full understanding. Strict return precautions discussed. Patient expresses understanding and agreement to plan.   Portions of this note were generated with Scientist, clinical (histocompatibility and immunogenetics). Dictation errors may occur despite best attempts at proofreading.  Final Clinical Impression(s) / ED Diagnoses Final diagnoses:  Hypertension, unspecified type    Rx / DC Orders ED Discharge Orders    None       Rosana Hoes 01/25/21 2007    Long, Joshua G, MD 01/29/21 [phone]

## 2021-01-25 NOTE — ED Provider Notes (Signed)
Emergency Medicine Provider Triage Evaluation Note  Jeff Acevedo , a 60 y.o. male  was evaluated in triage.  Pt complains of headache ongoing for several months but worse in the last 2 weeks. Was seen by his neurologist today and bp was elevated so he was sent here for further eval. He denies chest pain or sob. He took his bp meds today.   Review of Systems  Positive: headache Negative: Vision changes, numbness, weakness  Physical Exam  BP (!) 176/97 (BP Location: Right Arm)   Pulse 73   Temp 98.6 F (37 C) (Oral)   Resp 14   Ht 6\' 1"  (1.854 m)   Wt 88.9 kg   SpO2 100%   BMI 25.86 kg/m  Gen:   Awake, no distress   Resp:  Normal effort  MSK:   Moves extremities without difficulty  Other:  Cranial nerves II-XII intact. No focal deficit.  Medical Decision Making  Medically screening exam initiated at 12:12 PM.  Appropriate orders placed.  REMON QUINTO was informed that the remainder of the evaluation will be completed by another provider, this initial triage assessment does not replace that evaluation, and the importance of remaining in the ED until their evaluation is complete.   Mardi Mainland, PA-C 01/25/21 1217    03/27/21, DO 01/25/21 1412

## 2021-01-29 ENCOUNTER — Telehealth: Payer: Self-pay

## 2021-01-29 ENCOUNTER — Other Ambulatory Visit: Payer: Self-pay | Admitting: Legal Medicine

## 2021-01-29 ENCOUNTER — Telehealth: Payer: Self-pay | Admitting: Neurology

## 2021-01-29 DIAGNOSIS — I10 Essential (primary) hypertension: Secondary | ICD-10-CM

## 2021-01-29 MED ORDER — VERAPAMIL HCL ER 120 MG PO TBCR: 120.0000 mg | EXTENDED_RELEASE_TABLET | Freq: Every day | ORAL | 1 refills | Status: DC

## 2021-01-29 NOTE — Telephone Encounter (Signed)
Tried calling pt LMOV[facility] take the referral to any medical supply store.

## 2021-01-29 NOTE — Telephone Encounter (Signed)
Patient called in wanting to find out how to go about getting the oxygen Dr. Everlena Cooper wanted him to use for his headaches?

## 2021-01-29 NOTE — Telephone Encounter (Signed)
Pt calling states the HCTZ he cannot function on. He has tried even one pill but still cannot function. States he is shaking. He would like to return the verapamil. He did go to ED on 5/12. They did CT in ED which was unremarkable. Pt states Dr. Everlena Cooper does believe this is just cluster headaches and prescribed oxygen. Pt states overall he feels better, he has returned to the verapamil yesterday.   Verapamil 180 mg twice daily. [facility] in [place].   Lorita Officer, CCMA 01/29/21 10:02 AM

## 2021-02-06 ENCOUNTER — Encounter: Payer: Self-pay | Admitting: Legal Medicine

## 2021-02-06 ENCOUNTER — Ambulatory Visit: Payer: BC Managed Care – PPO | Admitting: Legal Medicine

## 2021-02-06 ENCOUNTER — Other Ambulatory Visit: Payer: Self-pay

## 2021-02-06 VITALS — BP 130/60 | HR 79 | Temp 97.2°F | Resp 16 | Ht 70.5 in | Wt 195.0 lb

## 2021-02-06 DIAGNOSIS — G44011 Episodic cluster headache, intractable: Secondary | ICD-10-CM | POA: Diagnosis not present

## 2021-02-06 DIAGNOSIS — I10 Essential (primary) hypertension: Secondary | ICD-10-CM | POA: Diagnosis not present

## 2021-02-06 DIAGNOSIS — G44009 Cluster headache syndrome, unspecified, not intractable: Secondary | ICD-10-CM | POA: Insufficient documentation

## 2021-02-06 NOTE — Progress Notes (Signed)
Subjective:  Patient ID: Jeff Acevedo, male    DOB: 01-12-1961  Age: 60 y.o. MRN: [id number]  Chief Complaint  Patient presents with  . Headache    HPI : patient here for follow up of hypertension. He is on verapamil, BP improving  He has cluster headaches and saw neurologist who started him on topirate. And use nurtec PRN   Current Outpatient Medications on File Prior to Visit  Medication Sig Dispense Refill  . captopril (CAPOTEN) 100 MG tablet TAKE 1 TABLET BY MOUTH EVERY DAY 90 tablet 2  . Ibuprofen 200 MG CAPS Take 2 capsules (400 mg total) by mouth every 8 (eight) hours as needed (headache/fever).  0  . NURTEC 75 MG TBDP TAKE 1 TABLET BY MOUTH ONCE AS NEEDED FOR UP TO 1 DOSE (HEADACHE). 30 tablet 3  . rosuvastatin (CRESTOR) 40 MG tablet TAKE 1 TABLET BY MOUTH EVERY DAY 30 tablet 6  . Topiramate ER (TROKENDI XR) 100 MG CP24 Take 100 mg of amoxicillin by mouth daily.    . verapamil (CALAN-SR) 120 MG CR tablet Take 1 tablet (120 mg total) by mouth at bedtime. 90 tablet 1   No current facility-administered medications on file prior to visit.   Past Medical History:  Diagnosis Date  . Cluster headache   . Hypertension    Past Surgical History:  Procedure Laterality Date  . CARPAL TUNNEL RELEASE      Family History  Problem Relation Age of Onset  . Heart failure Other   . Heart failure Father    Social History   Socioeconomic History  . Marital status: Married    Spouse name: Not on file  . Number of children: 2  . Years of education: Not on file  . Highest education level: Not on file  Occupational History  . Occupation: lift & build furnitu  Tobacco Use  . Smoking status: Current Every Day Smoker    Packs/day: 0.25    Years: 21.00    Pack years: 5.25  . Smokeless tobacco: Never Used  Vaping Use  . Vaping Use: Never used  Substance and Sexual Activity  . Alcohol use: No  . Drug use: No  . Sexual activity: Yes    Partners: Female  Other Topics Concern   . Not on file  Social History Narrative   Lives with wife in one story home      Highest level of education is highschool         Left handed; Caffeine - 1 cup coffee am/ 2 sodas a day;      Exercise - walk at work   Left handed      Social Determinants of Health   Financial Resource Strain: Not on file  Food Insecurity: Not on file  Transportation Needs: Not on file  Physical Activity: Not on file  Stress: Not on file  Social Connections: Not on file    Review of Systems  Constitutional: Negative for activity change and appetite change.  HENT: Negative for congestion and rhinorrhea.   Eyes: Negative for visual disturbance.  Respiratory: Negative for chest tightness and shortness of breath.   Cardiovascular: Negative for chest pain, palpitations and leg swelling.  Gastrointestinal: Negative for abdominal distention and abdominal pain.  Genitourinary: Negative for difficulty urinating, dysuria and urgency.  Musculoskeletal: Negative for arthralgias and back pain.  Skin: Negative.   Neurological: Positive for headaches.  Psychiatric/Behavioral: Negative.      Objective:  BP 130/60  Pulse 79   Temp (!) 97.2 F (36.2 C)   Resp 16   Ht 5' 10.5" (1.791 m)   Wt 195 lb (88.5 kg)   SpO2 98%   BMI 27.58 kg/m   BP/Weight 02/06/2021 01/25/2021 01/25/2021  Systolic BP 130 170 185  Diastolic BP 60 90 98  Wt. (Lbs) 195 196 196  BMI 27.58 25.86 26.58    Physical Exam Vitals reviewed.  Constitutional:      Appearance: Normal appearance. He is well-developed and normal weight.  HENT:     Right Ear: Tympanic membrane normal.     Left Ear: Tympanic membrane normal.  Eyes:     Extraocular Movements: Extraocular movements intact.     Conjunctiva/sclera: Conjunctivae normal.     Pupils: Pupils are equal, round, and reactive to light.  Cardiovascular:     Rate and Rhythm: Normal rate and regular rhythm.     Pulses: Normal pulses.     Heart sounds: Normal heart sounds.  No murmur heard. No gallop.   Pulmonary:     Effort: Pulmonary effort is normal. No respiratory distress.     Breath sounds: Normal breath sounds. No rales.  Abdominal:     General: Abdomen is flat. Bowel sounds are normal.  Musculoskeletal:     Cervical back: Normal range of motion.  Skin:    General: Skin is warm.     Capillary Refill: Capillary refill takes less than 2 seconds.  Neurological:     General: No focal deficit present.     Mental Status: He is alert and oriented to person, place, and time.       Lab Results  Component Value Date   WBC 11.5 (H) 01/25/2021   HGB 15.7 01/25/2021   HCT 48.8 01/25/2021   PLT 162 01/25/2021   GLUCOSE 93 01/25/2021   ALT 14 (L) 01/03/2016   AST 27 01/03/2016   NA 141 01/25/2021   K 4.0 01/25/2021   CL 107 01/25/2021   CREATININE 1.08 01/25/2021   BUN 14 01/25/2021   CO2 30 01/25/2021      Assessment & Plan:   Diagnoses and all orders for this visit: HTN (hypertension), benign An individual hypertension care plan was established and reinforced today.  The patient's status was assessed using clinical findings on exam and labs or diagnostic tests. The patient's success at meeting treatment goals on disease specific evidence-based guidelines and found to be fair controlled. SELF MANAGEMENT: The patient and I together assessed ways to personally work towards obtaining the recommended goals. RECOMMENDATIONS: avoid decongestants found in common cold remedies, decrease consumption of alcohol, perform routine monitoring of BP with home BP cuff, exercise, reduction of dietary salt, take medicines as prescribed, try not to miss doses and quit smoking.  Regular exercise and maintaining a healthy weight is needed.  Stress reduction may help. A CLINICAL SUMMARY including written plan identify barriers to care unique to individual due to social or financial issues.  We attempt to mutually creat solutions for individual and family  understanding.  Intractable episodic cluster headache Patient having cluster headaches and saw neurology who prescribed topamirate and O2 by nasal canula.  Patient requires Home O2 to maintain present status and to prevent deterioration of condition     25 minute visit, review of neuro records  Follow-up: Return in about 3 months (around 05/09/2021) for headaches.  An After Visit Summary was printed and given to the patient.  Brent Bulla, MD [facility] [phone]

## 2021-03-13 ENCOUNTER — Other Ambulatory Visit: Payer: Self-pay | Admitting: Legal Medicine

## 2021-03-13 DIAGNOSIS — I10 Essential (primary) hypertension: Secondary | ICD-10-CM

## 2021-05-10 ENCOUNTER — Other Ambulatory Visit: Payer: Self-pay

## 2021-05-10 ENCOUNTER — Ambulatory Visit: Payer: BC Managed Care – PPO | Admitting: Legal Medicine

## 2021-05-10 ENCOUNTER — Encounter: Payer: Self-pay | Admitting: Legal Medicine

## 2021-05-10 VITALS — BP 142/86 | HR 87 | Temp 98.3°F | Ht 71.0 in | Wt 191.6 lb

## 2021-05-10 DIAGNOSIS — G500 Trigeminal neuralgia: Secondary | ICD-10-CM

## 2021-05-10 DIAGNOSIS — I10 Essential (primary) hypertension: Secondary | ICD-10-CM | POA: Diagnosis not present

## 2021-05-10 DIAGNOSIS — G44011 Episodic cluster headache, intractable: Secondary | ICD-10-CM

## 2021-05-10 MED ORDER — NURTEC 75 MG PO TBDP: 1.0000 | ORAL_TABLET | Freq: Once | ORAL | 3 refills | Status: AC | PRN

## 2021-05-10 NOTE — Progress Notes (Signed)
Established Patient Office Visit  Subjective:  Patient ID: Jeff Acevedo, male    DOB: May 28, 1961  Age: 60 y.o. MRN: [id number]  CC:  Chief Complaint  Patient presents with   Headache    3 month follow up. Headaches are doing better. Patient states when the season changes is when the headaches are the worse.     HPI Jeff Acevedo presents for cluster headaches, they are worse on change of season.  He requires O2 @L /min to relieve headaches.  History reviewed. No pertinent past medical history.  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  Tobacco Use   Smoking status: Every Day    Packs/day: 0.25    Years: 21.00    Pack years: 5.25    Types: Cigarettes   Smokeless tobacco: Never  Vaping Use   Vaping Use: Never used  Substance and Sexual Activity   Alcohol use: No   Drug use: No   Sexual activity: Yes    Partners: Female  Other Topics Concern   Not on file  Social History Narrative   Lives with wife in one story home      Highest level of education is highschool         Left handed; Caffeine - 1 cup coffee am/ 2 sodas a day;      Exercise - walk at work   Left handed      Social Determinants of Health   Financial Resource Strain: Not on file  Food Insecurity: Not on file  Transportation Needs: Not on file  Physical Activity: Not on file  Stress: Not on file  Social Connections: Not on file  Intimate Partner Violence: Not on file    Outpatient Medications Prior to Visit  Medication Sig Dispense Refill   hydrochlorothiazide (HYDRODIURIL) 25 MG tablet Take 25 mg  by mouth every morning.     Ibuprofen 200 MG CAPS Take 2 capsules (400 mg total) by mouth every 8 (eight) hours as needed (headache/fever).  0   rosuvastatin (CRESTOR) 40 MG tablet TAKE 1 TABLET BY MOUTH EVERY DAY 30 tablet 6   Topiramate ER (TROKENDI XR) 100 MG CP24 Take 100 mg of amoxicillin by mouth daily.     verapamil (CALAN-SR) 120 MG CR tablet Take 1 tablet (120 mg total) by mouth at bedtime. 90 tablet 1   NURTEC 75 MG TBDP TAKE 1 TABLET BY MOUTH ONCE AS NEEDED FOR UP TO 1 DOSE (HEADACHE). 30 tablet 3   captopril (CAPOTEN) 100 MG tablet TAKE 1 TABLET BY MOUTH EVERY DAY (Patient not taking: Reported on 05/10/2021) 90 tablet 2   No facility-administered medications prior to visit.    No Known Allergies  ROS Review of Systems  Constitutional:  Negative for chills, fatigue and fever.  HENT:  Negative for congestion, ear pain and sore throat.   Respiratory:  Negative for cough and shortness of breath.   Cardiovascular:  Negative for chest pain.  Gastrointestinal:  Negative for abdominal pain, constipation, diarrhea,  nausea and vomiting.  Endocrine: Negative for polydipsia, polyphagia and polyuria.  Genitourinary:  Negative for dysuria and frequency.  Musculoskeletal:  Negative for arthralgias and myalgias.  Neurological:  Negative for dizziness and headaches.  Psychiatric/Behavioral:  Negative for dysphoric mood.        No dysphoria     Objective:    Physical Exam Vitals reviewed.  Constitutional:      Appearance: He is well-developed.  HENT:     Head: Normocephalic.     Mouth/Throat:     Mouth: Mucous membranes are moist.  Eyes:     General: No visual field deficit.    Extraocular Movements: Extraocular movements intact.     Pupils: Pupils are equal, round, and reactive to light.  Cardiovascular:     Rate and Rhythm: Normal rate and regular rhythm.     Heart sounds: Normal heart sounds. No murmur heard.   No gallop.  Pulmonary:     Effort: Pulmonary effort is normal.  No respiratory distress.     Breath sounds: No wheezing.  Abdominal:     General: Bowel sounds are normal. There is no distension.     Palpations: Abdomen is soft.     Tenderness: There is no abdominal tenderness.  Musculoskeletal:        General: Normal range of motion.     Cervical back: Normal range of motion and neck supple.  Skin:    General: Skin is warm.     Capillary Refill: Capillary refill takes less than 2 seconds.  Neurological:     Mental Status: He is alert.     Cranial Nerves: No cranial nerve deficit, dysarthria or facial asymmetry.     Deep Tendon Reflexes: Reflexes normal. Babinski sign absent on the right side. Babinski sign absent on the left side.  Psychiatric:        Mood and Affect: Mood normal.        Speech: Speech normal.    BP (!) 142/86   Pulse 87   Temp 98.3 F (36.8 C)   Ht 5\' 11"  (1.803 m)   Wt 191 lb 9.6 oz (86.9 kg)   SpO2 98%   BMI 26.72 kg/m  Wt Readings from Last 3 Encounters:  05/10/21 191 lb 9.6 oz (86.9 kg)  02/06/21 195 lb (88.5 kg)  01/25/21 196 lb (88.9 kg)     Health Maintenance Due  Topic Date Due   Pneumococcal Vaccine 79-35 Years old (1 - PCV) Never done   Hepatitis C Screening  Never done   TETANUS/TDAP  Never done   COLONOSCOPY (Pts 45-29yrs Insurance coverage will need to be confirmed)  Never done   Zoster Vaccines- Shingrix (1 of 2) Never done   COVID-19 Vaccine (3 - Booster for Pfizer series) 10/12/2020   INFLUENZA VACCINE  04/16/2021    There are no preventive care reminders to display for this patient.  No results found for: TSH Lab Results  Component Value Date   WBC 11.5 (H) 01/25/2021   HGB 15.7 01/25/2021   HCT 48.8 01/25/2021   MCV 93.0 01/25/2021   PLT 162 01/25/2021   Lab Results  Component Value Date   NA 141 01/25/2021   K 4.0 01/25/2021   CO2 30 01/25/2021   GLUCOSE 93 01/25/2021   BUN 14 01/25/2021   CREATININE 1.08 01/25/2021   BILITOT 0.6 01/03/2016   ALKPHOS 72 01/03/2016   AST 27  01/03/2016   ALT 14 (L) 01/03/2016   PROT 7.0 01/03/2016  ALBUMIN 3.1 (L) 01/03/2016   CALCIUM 9.2 01/25/2021   ANIONGAP 4 (L) 01/25/2021   No results found for: CHOL No results found for: HDL No results found for: LDLCALC No results found for: TRIG No results found for: CHOLHDL No results found for: HBZJ6R    Assessment & Plan:   Problem List Items Addressed This Visit       Cardiovascular and Mediastinum   HTN (hypertension), benign (Chronic)   Relevant Medications   hydrochlorothiazide (HYDRODIURIL) 25 MG tablet An individual hypertension care plan was established and reinforced today.  The patient's status was assessed using clinical findings on exam and labs or diagnostic tests. The patient's success at meeting treatment goals on disease specific evidence-based guidelines and found to be well controlled. SELF MANAGEMENT: The patient and I together assessed ways to personally work towards obtaining the recommended goals. RECOMMENDATIONS: avoid decongestants found in common cold remedies, decrease consumption of alcohol, perform routine monitoring of BP with home BP cuff, exercise, reduction of dietary salt, take medicines as prescribed, try not to miss doses and quit smoking.  Regular exercise and maintaining a healthy weight is needed.  Stress reduction may help. A CLINICAL SUMMARY including written plan identify barriers to care unique to individual due to social or financial issues.  We attempt to mutually creat solutions for individual and family understanding.      Nervous and Auditory   Cluster headaches - Primary   Relevant Medications   Rimegepant Sulfate (NURTEC) 75 MG TBDP   Other Relevant Orders   For home use only DME oxygen Patient has recurrent cluster headaches that are controled with Oxygen treatment.  This is a chronic diagnosis and he has been seen by neurology who prescribed oxygen.  Patient requires oxygen tank to control his cluster headaches with 2L/min.   He is t use PRN cluster headache.   Other Visit Diagnoses        Relevant Medications   Rimegepant Sulfate (NURTEC) 75 MG TBDP         Follow-up: Return in about 3 months (around 08/10/2021).    Brent Bulla, MD

## 2021-05-21 ENCOUNTER — Other Ambulatory Visit: Payer: Self-pay | Admitting: Legal Medicine

## 2021-05-22 NOTE — Progress Notes (Deleted)
NEUROLOGY FOLLOW UP OFFICE NOTE  JAMIR RONE [id number]  Assessment/Plan:   Cluster headache  prevention:  *** Migraine rescue:  *** Limit use of pain relievers to no more than 2 days out of week to prevent risk of rebound or medication-overuse headache. Keep headache diary Follow up ***   Subjective:  Jeff Acevedo is a 60 year old male with hypertension, cluster headache and history of viral meningitis in 2017 who follows up for right sided trigeminal neuralgia.   UPDATE: CT head on 01/25/2021 personally reviewed was unremarkable.  Started Trokendi in May.   ***  Current rescue:  O2, Elyxyb, Nurtec Current preventative:  verapamil CR 360mg , Trokendi XR 100mg  QD   HISTORY: He has a past history of cluster headaches, which were left-sided.  They were controlled on verapamil.    Since 2018, he has had severe right sided facial pain from right nostril down to the jaw.  Initially, he had a tooth extraction but pain never resolved.  He was then treated for sinus infection.  X-ray of sinuses were negative.  It is a severe shooting paroxysmal pain, lasting 10 to 15 minutes but other times 45-60 minutes.  It occurs every couple of day for several months.  In March to April 2020, it occurred daily, often waking him up at night.  Right sided of face becomes red, right eye lacrimation and conjunctival injection, nasal congestion.  No associated nausea, vomiting, photophobia, phonophobia, visual disturbance, ptosis.  Worse during change of seasons.  Otherwise, no specific triggers such as talking, eating or brushing teeth.  It would occur spontaneously and abort spontaneously.    He has history of viral meningitis in 2017.   Past medications:  Amitriptyline, oxcarbazepine, Flexeril, topiramate  PAST MEDICAL HISTORY: No past medical history on file.  MEDICATIONS: Current Outpatient Medications on File Prior to Visit  Medication Sig Dispense Refill   captopril (CAPOTEN) 100 MG  tablet TAKE 1 TABLET BY MOUTH EVERY DAY (Patient not taking: Reported on 05/10/2021) 90 tablet 2   hydrochlorothiazide (HYDRODIURIL) 25 MG tablet Take 25 mg by mouth every morning.     Ibuprofen 200 MG CAPS Take 2 capsules (400 mg total) by mouth every 8 (eight) hours as needed (headache/fever).  0   Rimegepant Sulfate (NURTEC) 75 MG TBDP Take 1 tablet by mouth once as needed for up to 1 dose (headache). 10 tablet 3   rosuvastatin (CRESTOR) 40 MG tablet TAKE 1 TABLET BY MOUTH EVERY DAY 90 tablet 2   Topiramate ER (TROKENDI XR) 100 MG CP24 Take 100 mg of amoxicillin by mouth daily.     verapamil (CALAN-SR) 120 MG CR tablet Take 1 tablet (120 mg total) by mouth at bedtime. 90 tablet 1   No current facility-administered medications on file prior to visit.    ALLERGIES: No Known Allergies  FAMILY HISTORY: Family History  Problem Relation Age of Onset   Heart failure Other    Heart failure Father       Objective:  *** General: No acute distress.  Patient appears ***-groomed.   Head:  Normocephalic/atraumatic Eyes:  Fundi examined but not visualized Neck: supple, no paraspinal tenderness, full range of motion Heart:  Regular rate and rhythm Lungs:  Clear to auscultation bilaterally Back: No paraspinal tenderness Neurological Exam: alert and oriented to person, place, and time.  Speech fluent and not dysarthric, language intact.  CN II-XII intact. Bulk and tone normal, muscle strength 5/5 throughout.  Sensation to light touch intact.  Deep tendon reflexes 2+ throughout, toes downgoing.  Finger to nose testing intact.  Gait normal, Romberg negative.   Shon Millet, DO  CC: ***

## 2021-05-23 ENCOUNTER — Ambulatory Visit: Payer: BC Managed Care – PPO | Admitting: Neurology

## 2021-06-10 ENCOUNTER — Other Ambulatory Visit: Payer: Self-pay | Admitting: Legal Medicine

## 2021-07-12 ENCOUNTER — Ambulatory Visit: Payer: BC Managed Care – PPO | Admitting: Neurology

## 2021-08-16 ENCOUNTER — Encounter: Payer: Self-pay | Admitting: Legal Medicine

## 2021-08-16 ENCOUNTER — Ambulatory Visit: Payer: BC Managed Care – PPO | Admitting: Legal Medicine

## 2021-08-16 ENCOUNTER — Other Ambulatory Visit: Payer: Self-pay

## 2021-08-16 VITALS — BP 140/80 | HR 80 | Temp 98.1°F | Resp 16 | Ht 71.0 in | Wt 193.0 lb

## 2021-08-16 DIAGNOSIS — E782 Mixed hyperlipidemia: Secondary | ICD-10-CM

## 2021-08-16 DIAGNOSIS — G44011 Episodic cluster headache, intractable: Secondary | ICD-10-CM | POA: Diagnosis not present

## 2021-08-16 DIAGNOSIS — I10 Essential (primary) hypertension: Secondary | ICD-10-CM | POA: Diagnosis not present

## 2021-08-16 MED ORDER — ROSUVASTATIN CALCIUM 40 MG PO TABS: 40.0000 mg | ORAL_TABLET | Freq: Every day | ORAL | 2 refills | Status: DC

## 2021-08-16 MED ORDER — VERAPAMIL HCL ER 120 MG PO TBCR: 120.0000 mg | EXTENDED_RELEASE_TABLET | Freq: Every day | ORAL | 2 refills | Status: DC

## 2021-08-16 MED ORDER — HYDROCHLOROTHIAZIDE 25 MG PO TABS: ORAL_TABLET | ORAL | 2 refills | Status: AC

## 2021-08-16 MED ORDER — CAPTOPRIL 100 MG PO TABS: 100.0000 mg | ORAL_TABLET | Freq: Every day | ORAL | 2 refills | Status: AC

## 2021-08-16 NOTE — Progress Notes (Signed)
Subjective:  Patient ID: Jeff Acevedo, male    DOB: [birth year]  Age: 60 y.o. MRN: [id number]  Chief Complaint  Patient presents with   Headache    HPI: chronic headache  Patient has history of custer headaches and is doing well.  He has not had to use O2.  Patient presents for follow up of hypertension.  Patient tolerating verapamil, captopril and HCTZ well with side effects.  Patient was diagnosed with hypertension 2010 so has been treated for hypertension for 12 years.Patient is working on maintaining diet and exercise regimen and follows up as directed. Complication include none.   Patient presents with hyperlipidemia.  Compliance with treatment has been good; patient takes medicines as directed, maintains low cholesterol diet, follows up as directed, and maintains exercise regimen.  Patient is using crestor without problems.    Current Outpatient Medications on File Prior to Visit  Medication Sig Dispense Refill   Ibuprofen 200 MG CAPS Take 2 capsules (400 mg total) by mouth every 8 (eight) hours as needed (headache/fever).  0   Rimegepant Sulfate (NURTEC) 75 MG TBDP Take 1 tablet by mouth once as needed for up to 1 dose (headache). 10 tablet 3   No current facility-administered medications on file prior to visit.   History reviewed. No pertinent past medical history. 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  Tobacco Use   Smoking status: Every Day    Packs/day: 0.30    Years: 21.00    Pack years: 6.30    Types: Cigarettes   Smokeless tobacco: Never  Vaping Use   Vaping Use: Never  used  Substance and Sexual Activity   Alcohol use: No   Drug use: No   Sexual activity: Yes    Partners: Female  Other Topics Concern   Not on file  Social History Narrative   Lives with wife in one story home      Highest level of education is highschool         Left handed; Caffeine - 1 cup coffee am/ 2 sodas a day;      Exercise - walk at work   Left handed      Social Determinants of Health   Financial Resource Strain: Not on file  Food Insecurity: Not on file  Transportation Needs: Not on file  Physical Activity: Not on file  Stress: Not on file  Social Connections: Not on file    Review of Systems  Constitutional:  Negative for chills, fatigue, fever and unexpected weight change.  HENT:  Negative for congestion, ear pain, sinus pain and sore throat.   Respiratory:  Negative for cough and shortness of breath.   Cardiovascular:  Negative for chest pain and palpitations.  Gastrointestinal:  Negative for abdominal pain, blood in stool, constipation, diarrhea, nausea and vomiting.  Endocrine: Negative for polydipsia.  Genitourinary:  Negative for dysuria.  Musculoskeletal:  Negative for back pain.  Skin:  Negative for rash.  Neurological:  Negative for headaches.  Objective:  BP 140/80   Pulse 80   Temp 98.1 F (36.7 C)   Resp 16   Ht 5\' 11"  (1.803 m)   Wt 193 lb (87.5 kg)   SpO2 98%   BMI 26.92 kg/m   BP/Weight 08/16/2021 05/10/2021 02/06/2021  Systolic BP 140 142 130  Diastolic BP 80 86 60  Wt. (Lbs) 193 191.6 195  BMI 26.92 26.72 27.58    Physical Exam Vitals reviewed.  Constitutional:      General: He is not in acute distress.    Appearance: He is well-developed.  HENT:     Head: Normocephalic and atraumatic.  Eyes:     Extraocular Movements: Extraocular movements intact.     Pupils: Pupils are equal, round, and reactive to light.  Cardiovascular:     Rate and Rhythm: Normal rate and regular rhythm.     Heart sounds: Normal heart sounds.  No murmur heard.   No gallop.  Pulmonary:     Effort: Pulmonary effort is normal. No respiratory distress.     Breath sounds: Normal breath sounds. No wheezing.  Abdominal:     General: There is no distension.     Palpations: Abdomen is soft.     Tenderness: There is no abdominal tenderness.  Musculoskeletal:        General: Normal range of motion.     Cervical back: Normal range of motion.  Skin:    General: Skin is warm.     Capillary Refill: Capillary refill takes less than 2 seconds.  Neurological:     Mental Status: He is alert.     Cranial Nerves: No cranial nerve deficit or facial asymmetry.     Sensory: No sensory deficit.     Motor: No weakness.     Deep Tendon Reflexes: Reflexes normal.  Psychiatric:        Mood and Affect: Mood normal.        Speech: Speech normal.        Behavior: Behavior normal.        Lab Results  Component Value Date   WBC 11.5 (H) 01/25/2021   HGB 15.7 01/25/2021   HCT 48.8 01/25/2021   PLT 162 01/25/2021   GLUCOSE 93 01/25/2021   ALT 14 (L) 01/03/2016   AST 27 01/03/2016   NA 141 01/25/2021   K 4.0 01/25/2021   CL 107 01/25/2021   CREATININE 1.08 01/25/2021   BUN 14 01/25/2021   CO2 30 01/25/2021      Assessment & Plan:   Problem List Items Addressed This Visit       Cardiovascular and Mediastinum   HTN (hypertension), benign (Chronic)   Relevant Medications   rosuvastatin (CRESTOR) 40 MG tablet   hydrochlorothiazide (HYDRODIURIL) 25 MG tablet   verapamil (CALAN-SR) 120 MG CR tablet   captopril (CAPOTEN) 100 MG tablet   Other Relevant Orders   CBC with Differential/Platelet   Comprehensive metabolic panel An individual hypertension care plan was established and reinforced today.  The patient's status was assessed using clinical findings on exam and labs or diagnostic tests. The patient's success at meeting treatment goals on disease specific evidence-based guidelines and found to be well controlled. SELF MANAGEMENT:  The patient and I together assessed ways to personally work towards obtaining the recommended goals. RECOMMENDATIONS: avoid decongestants found in common cold remedies, decrease consumption of alcohol, perform routine monitoring of BP with home BP cuff, exercise, reduction of dietary salt, take medicines as prescribed, try not to  miss doses and quit smoking.  Regular exercise and maintaining a healthy weight is needed.  Stress reduction may help. A CLINICAL SUMMARY including written plan identify barriers to care unique to individual due to social or financial issues.  We attempt to mutually creat solutions for individual and family understanding.      Nervous and Auditory   Cluster headaches - Primary   Relevant Medications   verapamil (CALAN-SR) 120 MG CR tablet AN INDIVIDUAL CARE PLAN cluster headaches was established and reinforced today.  The patient's status was assessed using clinical findings on exam, labs, and other diagnostic testing. Patient's success at meeting treatment goals based on disease specific evidence-bassed guidelines and found to be in good control. RECOMMENDATIONS include maintaining present medicines and treatment.    Other Visit Diagnoses     Mixed hyperlipidemia       Relevant Medications   rosuvastatin (CRESTOR) 40 MG tablet   hydrochlorothiazide (HYDRODIURIL) 25 MG tablet   verapamil (CALAN-SR) 120 MG CR tablet   captopril (CAPOTEN) 100 MG tablet   Other Relevant Orders   Lipid panel AN INDIVIDUAL CARE PLAN for hyperlipidemia/ cholesterol was established and reinforced today.  The patient's status was assessed using clinical findings on exam, lab and other diagnostic tests. The patient's disease status was assessed based on evidence-based guidelines and found to be well controlled. MEDICATIONS were reviewed. SELF MANAGEMENT GOALS have been discussed and patient's success at attaining the goal of low cholesterol was assessed. RECOMMENDATION given include regular  exercise 3 days a week and low cholesterol/low fat diet. CLINICAL SUMMARY including written plan to identify barriers unique to the patient due to social or economic  reasons was discussed.      .         Follow-up: Return in about 6 months (around 02/14/2022) for fasting.  An After Visit Summary was printed and given to the patient.  Brent Bulla, MD [facility] [phone]

## 2021-08-17 LAB — LIPID PANEL
Chol/HDL Ratio: 3.2 ratio (ref 0.0–5.0)
Cholesterol, Total: 133 mg/dL (ref 100–199)
HDL: 42 mg/dL (ref >39)
LDL Chol Calc (NIH): 80 mg/dL (ref 0–99)
Triglycerides: 50 mg/dL (ref 0–149)
VLDL Cholesterol Cal: 11 mg/dL (ref 5–40)

## 2021-08-17 LAB — CBC WITH DIFFERENTIAL/PLATELET
Basophils Absolute: 0 10*3/uL (ref 0.0–0.2)
Basos: 0 %
EOS (ABSOLUTE): 0.1 10*3/uL (ref 0.0–0.4)
Eos: 1 %
Hematocrit: 47.9 % (ref 37.5–51.0)
Hemoglobin: 16.1 g/dL (ref 13.0–17.7)
Immature Grans (Abs): 0 10*3/uL (ref 0.0–0.1)
Immature Granulocytes: 0 %
Lymphocytes Absolute: 2.5 10*3/uL (ref 0.7–3.1)
Lymphs: 32 %
MCH: 29.8 pg (ref 26.6–33.0)
MCHC: 33.6 g/dL (ref 31.5–35.7)
MCV: 89 fL (ref 79–97)
Monocytes Absolute: 0.6 10*3/uL (ref 0.1–0.9)
Monocytes: 8 %
Neutrophils Absolute: 4.6 10*3/uL (ref 1.4–7.0)
Neutrophils: 59 %
Platelets: 189 10*3/uL (ref 150–450)
RBC: 5.4 x10E6/uL (ref 4.14–5.80)
RDW: 11.9 % (ref 11.6–15.4)
WBC: 7.8 10*3/uL (ref 3.4–10.8)

## 2021-08-17 LAB — COMPREHENSIVE METABOLIC PANEL
ALT: 20 IU/L (ref 0–44)
AST: 37 IU/L (ref 0–40)
Albumin/Globulin Ratio: 2 (ref 1.2–2.2)
Albumin: 4.5 g/dL (ref 3.8–4.8)
Alkaline Phosphatase: 92 IU/L (ref 44–121)
BUN/Creatinine Ratio: 15 (ref 10–24)
BUN: 16 mg/dL (ref 8–27)
Bilirubin Total: 0.2 mg/dL (ref 0.0–1.2)
CO2: 28 mmol/L (ref 20–29)
Calcium: 9.1 mg/dL (ref 8.6–10.2)
Chloride: 104 mmol/L (ref 96–106)
Creatinine, Ser: 1.07 mg/dL (ref 0.76–1.27)
Globulin, Total: 2.3 g/dL (ref 1.5–4.5)
Glucose: 78 mg/dL (ref 70–99)
Potassium: 4.3 mmol/L (ref 3.5–5.2)
Sodium: 142 mmol/L (ref 134–144)
Total Protein: 6.8 g/dL (ref 6.0–8.5)
eGFR: 78 mL/min/{1.73_m2} (ref >59)

## 2021-08-17 LAB — CARDIOVASCULAR RISK ASSESSMENT

## 2021-08-17 NOTE — Progress Notes (Signed)
Cbc normal, kidney and liver tests normal,  lp

## 2021-10-27 IMAGING — CT CT HEAD W/O CM
4 series · 17 of 47 positions shown, 19 images · non-contrast
Comparison: 01/03/2016

CLINICAL DATA: Headache.

EXAM:
CT HEAD WITHOUT CONTRAST
TECHNIQUE: Contiguous axial images were obtained from the base of the skull
through the vertex without intravenous contrast.

[Series 3: head without · axial · non-contrast · 0.44mm/px · z∈[-154,-29]mm · 7 of 35 slices shown, 9 images]
[im 5/35  brain]
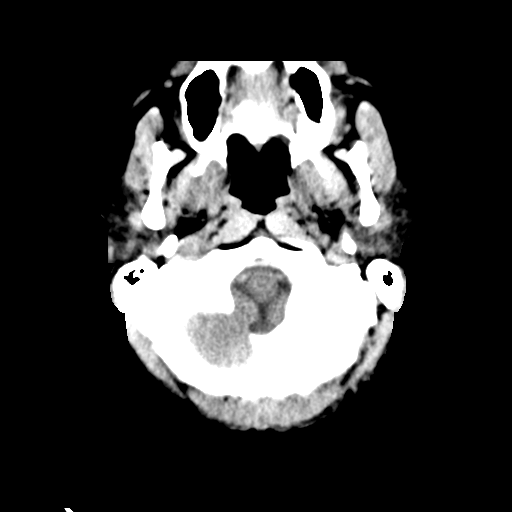
[im 5/35  bone]
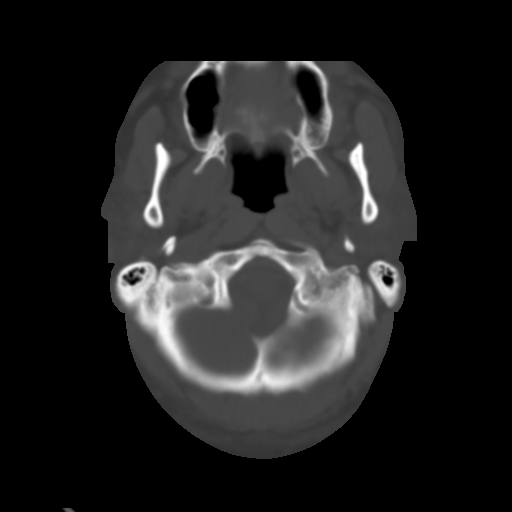
[im 9/35  brain]
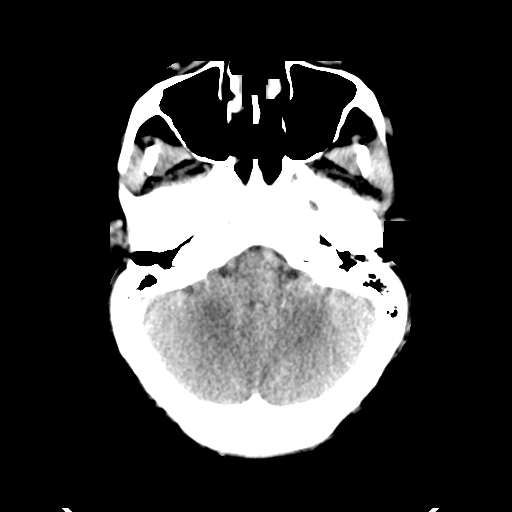
[im 13/35  brain]
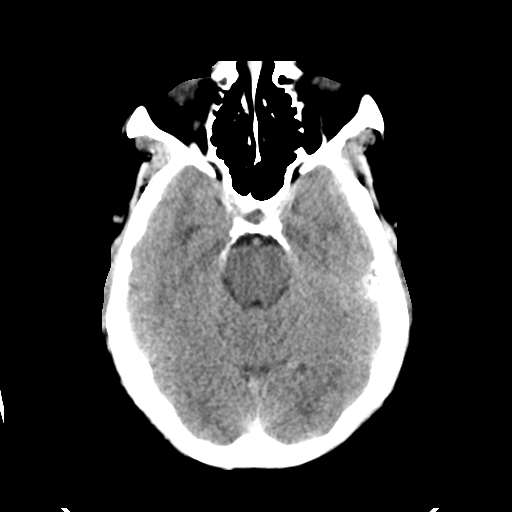
[im 18/35  brain]
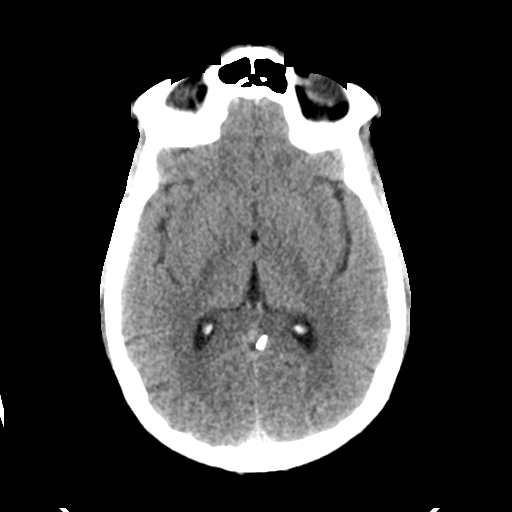
[im 22/35  brain]
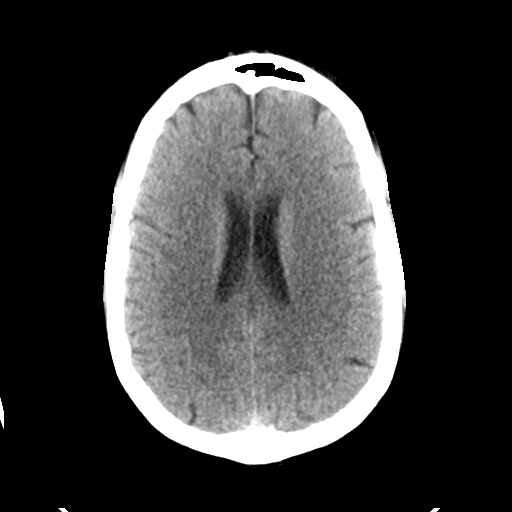
[im 22/35  bone]
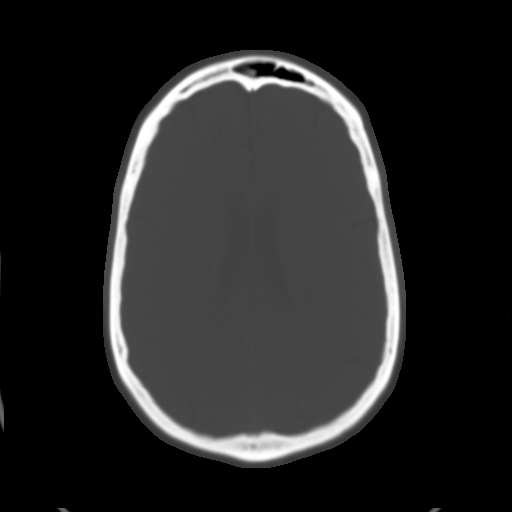
[im 26/35  brain]
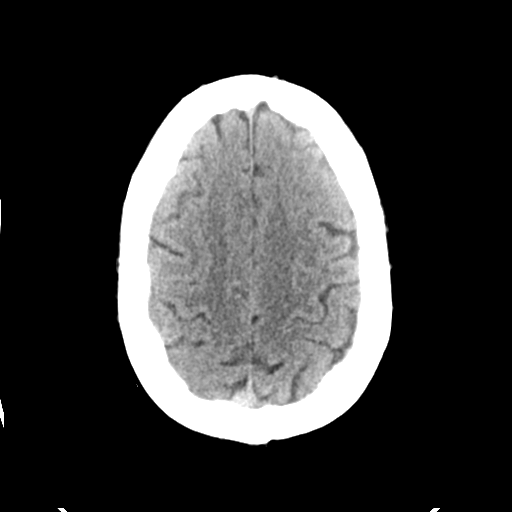
[im 30/35  brain]
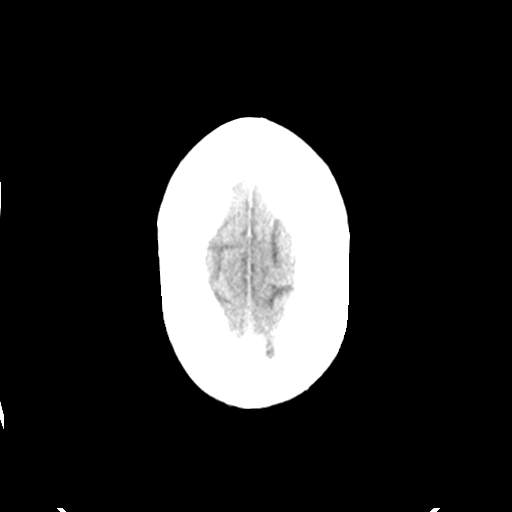

[Series 4: head bone · axial · 0.44mm/px · z∈[-158,-98]mm · 4 of 87 slices shown]
[im 9/87  bone]
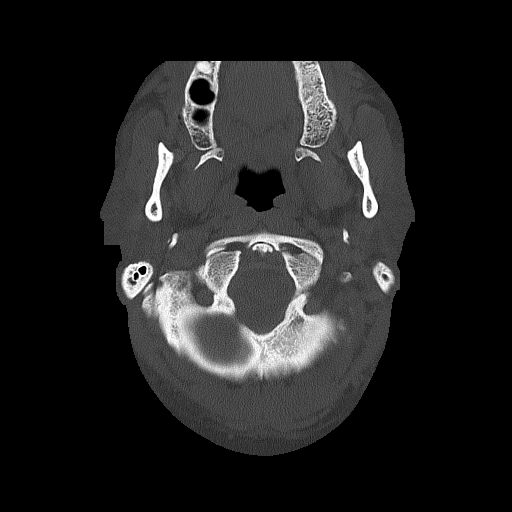
[im 18/87  bone]
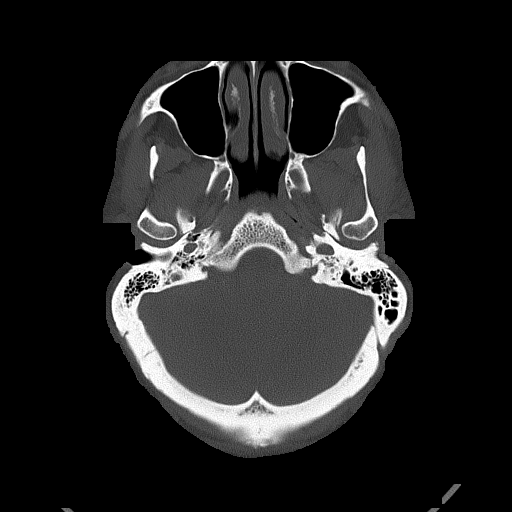
[im 26/87  bone]
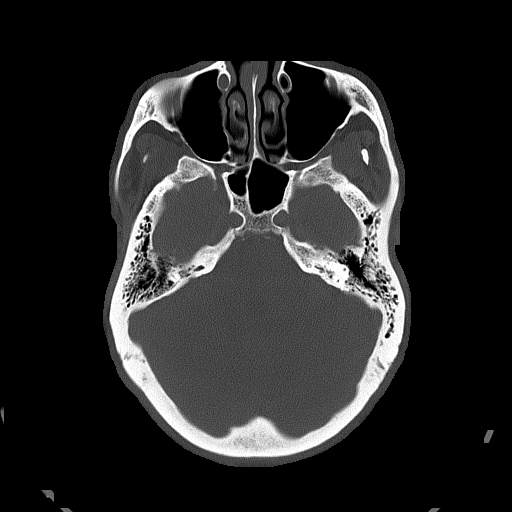
[im 39/87  bone]
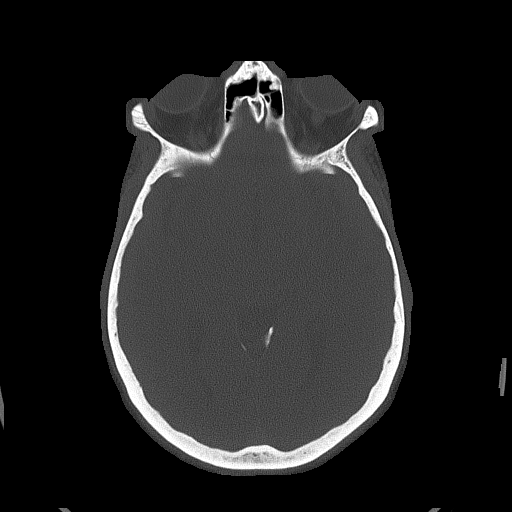

[Series 5: head without cor · coronal · non-contrast · 0.35mm/px · 3 of 70 slices shown]
[im 24/70  brain]
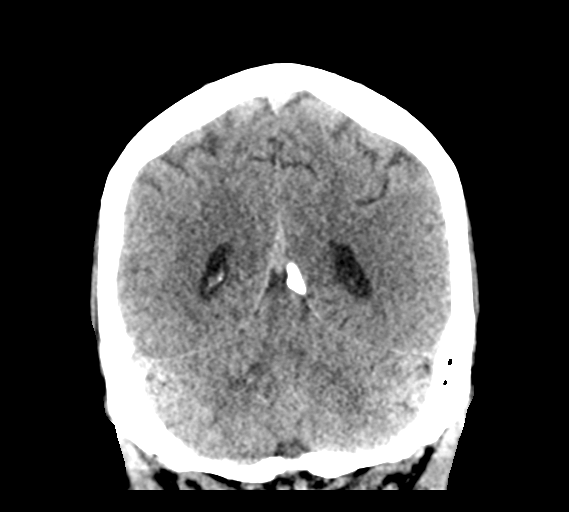
[im 31/70  brain]
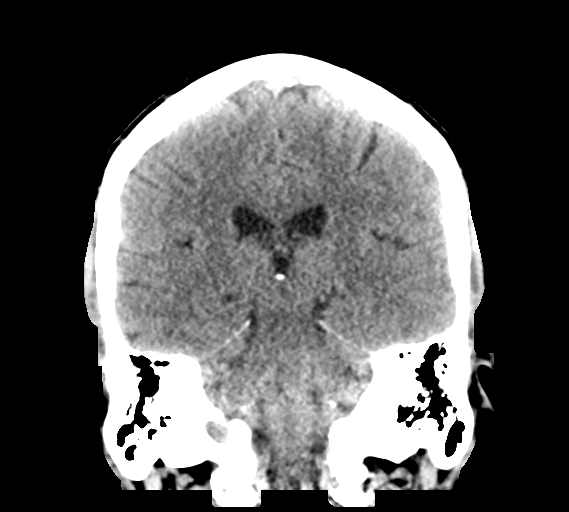
[im 39/70  brain]
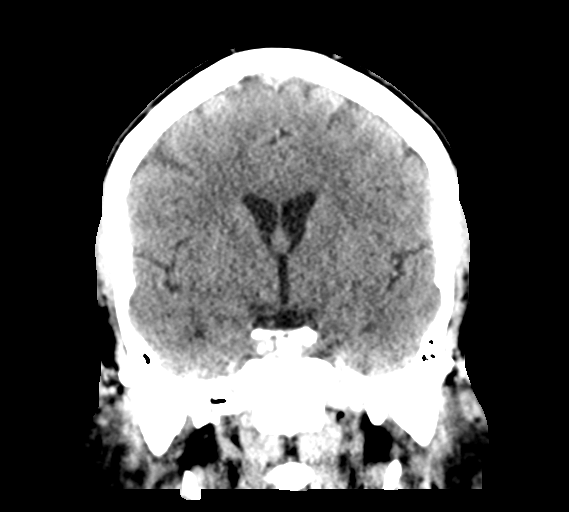

[Series 6: head without sag · sagittal · non-contrast · 0.39mm/px · 3 of 67 slices shown]
[im 23/67  brain]
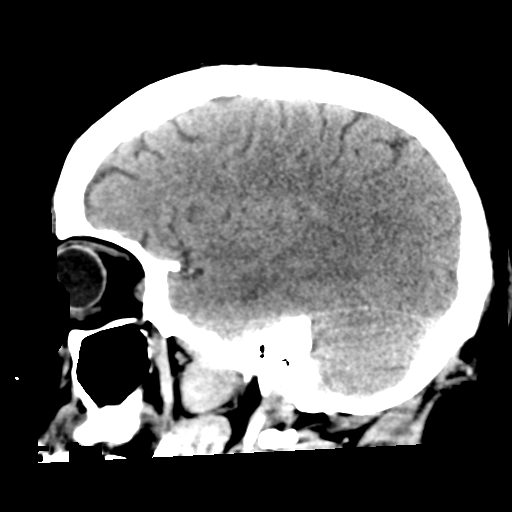
[im 34/67  brain]
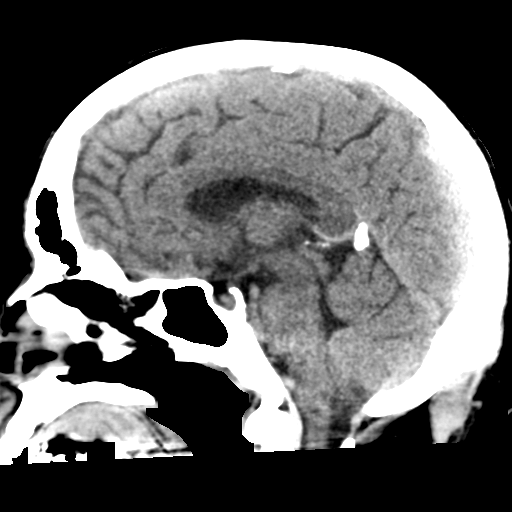
[im 45/67  brain]
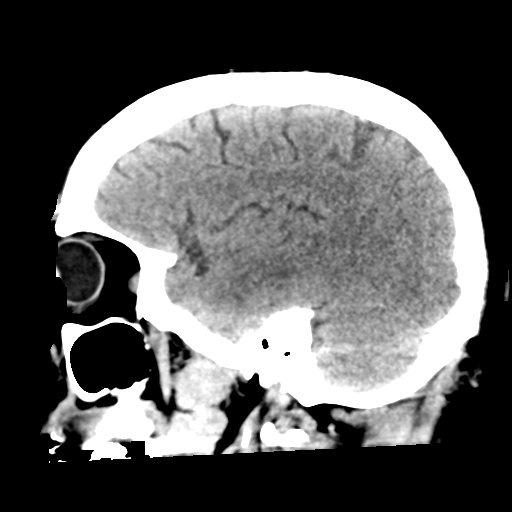

[17 of 47 positions shown; findings below may reference images not displayed]

FINDINGS: Brain: There is no evidence for acute hemorrhage, hydrocephalus,
mass lesion, or abnormal extra-axial fluid collection. No definite
CT evidence for acute infarction.

Vascular: No hyperdense vessel or unexpected calcification.

Skull: No evidence for fracture. No worrisome lytic or sclerotic
lesion.

Sinuses/Orbits: The visualized paranasal sinuses and mastoid air
cells are clear. Visualized portions of the globes and intraorbital
fat are unremarkable.

Other: None.
IMPRESSION: Unremarkable CT evaluation of the brain.

## 2022-02-19 ENCOUNTER — Encounter: Payer: Self-pay | Admitting: Legal Medicine

## 2022-02-19 ENCOUNTER — Ambulatory Visit: Payer: BC Managed Care – PPO | Admitting: Legal Medicine

## 2022-02-19 VITALS — BP 136/84 | HR 89 | Temp 97.2°F | Ht 73.0 in | Wt 190.0 lb

## 2022-02-19 DIAGNOSIS — Z1159 Encounter for screening for other viral diseases: Secondary | ICD-10-CM | POA: Diagnosis not present

## 2022-02-19 DIAGNOSIS — E782 Mixed hyperlipidemia: Secondary | ICD-10-CM | POA: Diagnosis not present

## 2022-02-19 DIAGNOSIS — I10 Essential (primary) hypertension: Secondary | ICD-10-CM

## 2022-02-19 DIAGNOSIS — G44011 Episodic cluster headache, intractable: Secondary | ICD-10-CM | POA: Diagnosis not present

## 2022-02-19 DIAGNOSIS — G5612 Other lesions of median nerve, left upper limb: Secondary | ICD-10-CM

## 2022-02-19 NOTE — Progress Notes (Signed)
Subjective:  Patient ID: Jeff Acevedo, male    DOB: 1960-11-11  Age: 61 y.o. MRN: [id number]  Chief Complaint  Patient presents with   Hypertension   Hyperlipidemia   Migraine  Chronc visit:   Hypertension Pertinent negatives include no chest pain, headaches or shortness of breath.  Hyperlipidemia Pertinent negatives include no chest pain, myalgias or shortness of breath.  Migraine  Pertinent negatives include no abdominal pain, coughing, dizziness, ear pain, fever, nausea, sore throat or vomiting. His past medical history is significant for hypertension.   He is still having cluster headaches at times.   Current Outpatient Medications on File Prior to Visit  Medication Sig Dispense Refill   captopril (CAPOTEN) 100 MG tablet Take 1 tablet (100 mg total) by mouth daily. 90 tablet 2   hydrochlorothiazide (HYDRODIURIL) 25 MG tablet TAKE 1 TABLET BY MOUTH EVERY DAY IN THE MORNING 90 tablet 2   Ibuprofen 200 MG CAPS Take 2 capsules (400 mg total) by mouth every 8 (eight) hours as needed (headache/fever).  0   Rimegepant Sulfate (NURTEC) 75 MG TBDP Take 1 tablet by mouth once as needed for up to 1 dose (headache). 10 tablet 3   rosuvastatin (CRESTOR) 40 MG tablet Take 1 tablet (40 mg total) by mouth daily. 90 tablet 2   verapamil (CALAN-SR) 120 MG CR tablet Take 1 tablet (120 mg total) by mouth at bedtime. 90 tablet 2   No current facility-administered medications on file prior to visit.   History reviewed. No pertinent past medical history. 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  Tobacco  Use   Smoking status: Every Day    Packs/day: 0.30    Years: 21.00    Pack years: 6.30    Types: Cigarettes   Smokeless tobacco: Never  Vaping Use   Vaping Use: Never used  Substance and Sexual Activity   Alcohol use: No   Drug use: No   Sexual activity: Yes    Partners: Female  Other Topics Concern   Not on file  Social History Narrative   Lives with wife in one story home      Highest level of education is highschool         Left handed; Caffeine - 1 cup coffee am/ 2 sodas a day;      Exercise - walk at work   Left handed      Social Determinants of Health   Financial Resource Strain: Not on file  Food Insecurity: Not on file  Transportation Needs: Not on file  Physical Activity: Not on file  Stress: Not on file  Social Connections: Not on file    Review of Systems  Constitutional:  Negative for chills, diaphoresis, fatigue and fever.  HENT:  Negative for congestion, ear pain and sore throat.   Respiratory:  Negative for cough and shortness of breath.   Cardiovascular:  Negative for chest pain and leg swelling.  Gastrointestinal:  Negative for abdominal pain, constipation, diarrhea, nausea and vomiting.  Genitourinary:  Negative for dysuria and urgency.  Musculoskeletal:  Negative for arthralgias and myalgias.  Neurological:  Negative for dizziness and headaches.  Psychiatric/Behavioral:  Negative for dysphoric mood.     Objective:  BP 136/84   Pulse 89   Temp (!) 97.2 F (36.2 C)   Ht 6\' 1"  (1.854 m)   Wt 190 lb (86.2 kg)   SpO2 100%   BMI 25.07 kg/m      02/19/2022    8:12 AM 08/16/2021    3:40 PM 05/10/2021    3:38 PM  BP/Weight  Systolic BP [id number]  Diastolic BP 84 80 86  Wt. (Lbs) 190 193 191.6  BMI 25.07 kg/m2 26.92 kg/m2 26.72 kg/m2    Physical Exam Vitals reviewed.  Constitutional:      General: He is not in acute distress.    Appearance: Normal appearance. He is obese.  HENT:     Head: Normocephalic.     Right Ear: Tympanic  membrane normal.     Left Ear: Tympanic membrane normal.     Nose: Nose normal.     Mouth/Throat:     Mouth: Mucous membranes are moist.  Eyes:     Extraocular Movements: Extraocular movements intact.     Conjunctiva/sclera: Conjunctivae normal.     Pupils: Pupils are equal, round, and reactive to light.  Cardiovascular:     Rate and Rhythm: Normal rate and regular rhythm.     Pulses: Normal pulses.     Heart sounds: Normal heart sounds. No murmur heard.   No gallop.  Pulmonary:     Effort: Pulmonary effort is normal. No respiratory distress.     Breath sounds: Normal breath sounds. No wheezing.  Abdominal:     General: Abdomen is flat. Bowel sounds are normal. There is no distension.     Palpations: Abdomen is soft.     Tenderness: There is no abdominal tenderness.  Musculoskeletal:        General: Normal range of motion.     Cervical back: Normal range of motion and neck supple.     Right lower leg: No edema.     Left lower leg: No edema.  Skin:    General: Skin is warm.     Capillary Refill: Capillary refill takes less than 2 seconds.  Neurological:     General: No focal deficit present.     Mental Status: He is alert and oriented to person, place, and time. Mental status is at baseline.     Gait: Gait normal.     Deep Tendon Reflexes: Reflexes normal.        Lab Results  Component Value Date   WBC 7.8 08/16/2021   HGB 16.1 08/16/2021   HCT 47.9 08/16/2021   PLT 189 08/16/2021   GLUCOSE 78 08/16/2021   CHOL 133 08/16/2021   TRIG 50 08/16/2021   HDL 42 08/16/2021   LDLCALC 80 08/16/2021   ALT 20 08/16/2021   AST 37 08/16/2021   NA 142 08/16/2021   K 4.3 08/16/2021   CL 104 08/16/2021   CREATININE 1.07 08/16/2021   BUN 16 08/16/2021   CO2 28 08/16/2021      Assessment & Plan:   Problem List Items Addressed This Visit       Cardiovascular and Mediastinum   HTN (hypertension), benign - Primary (Chronic)   Relevant Orders   CBC with  Differential/Platelet   Comprehensive metabolic panel An individual hypertension care plan was established and  reinforced today.  The patient's status was assessed using clinical findings on exam and labs or diagnostic tests. The patient's success at meeting treatment goals on disease specific evidence-based guidelines and found to be well controlled. SELF MANAGEMENT: The patient and I together assessed ways to personally work towards obtaining the recommended goals. RECOMMENDATIONS: avoid decongestants found in common cold remedies, decrease consumption of alcohol, perform routine monitoring of BP with home BP cuff, exercise, reduction of dietary salt, take medicines as prescribed, try not to miss doses and quit smoking.  Regular exercise and maintaining a healthy weight is needed.  Stress reduction may help. A CLINICAL SUMMARY including written plan identify barriers to care unique to individual due to social or financial issues.  We attempt to mutually creat solutions for individual and family understanding.      Nervous and Auditory   Cluster headaches   Relevant Orders   Ambulatory referral to ENT Patient continues to have cluster headaches he is using his medications which helps he does not have to use his oxygen very often although I recommended he do this to alleviate the headaches quickly.   Other Visit Diagnoses     Mixed hyperlipidemia       Relevant Orders   Lipid panel AN INDIVIDUAL CARE PLAN for hyperlipidemia/ cholesterol was established and reinforced today.  The patient's status was assessed using clinical findings on exam, lab and other diagnostic tests. The patient's disease status was assessed based on evidence-based guidelines and found to be fair controlled. MEDICATIONS were reviewed. SELF MANAGEMENT GOALS have been discussed and patient's success at attaining the goal of low cholesterol was assessed. RECOMMENDATION given include regular exercise 3 days a week and low  cholesterol/low fat diet. CLINICAL SUMMARY including written plan to identify barriers unique to the patient due to social or economic  reasons was discussed.    Need for hepatitis C screening test       Relevant Orders   Hepatitis C Antibody   Left median nerve neuropathy       Relevant Orders   Nerve conduction test Radicular pain left shoulder to wrist.  History of CTS, get NCS for possible cervical spine impingement     .    Orders Placed This Encounter  Procedures   Hepatitis C Antibody   CBC with Differential/Platelet   Comprehensive metabolic panel   Lipid panel   Ambulatory referral to ENT   Nerve conduction test     Follow-up: Return in about 6 months (around 08/21/2022).  An After Visit Summary was printed and given to the patient.  Reinaldo Meeker, MD [facility] [phone]

## 2022-02-20 LAB — COMPREHENSIVE METABOLIC PANEL
ALT: 16 IU/L (ref 0–44)
AST: 30 IU/L (ref 0–40)
Albumin/Globulin Ratio: 1.6 (ref 1.2–2.2)
Albumin: 4.2 g/dL (ref 3.8–4.8)
Alkaline Phosphatase: 100 IU/L (ref 44–121)
BUN/Creatinine Ratio: 14 (ref 10–24)
BUN: 17 mg/dL (ref 8–27)
Bilirubin Total: 0.3 mg/dL (ref 0.0–1.2)
CO2: 25 mmol/L (ref 20–29)
Calcium: 9.9 mg/dL (ref 8.6–10.2)
Chloride: 107 mmol/L — ABNORMAL HIGH (ref 96–106)
Creatinine, Ser: 1.19 mg/dL (ref 0.76–1.27)
Globulin, Total: 2.6 g/dL (ref 1.5–4.5)
Glucose: 107 mg/dL — ABNORMAL HIGH (ref 70–99)
Potassium: 4.4 mmol/L (ref 3.5–5.2)
Sodium: 145 mmol/L — ABNORMAL HIGH (ref 134–144)
Total Protein: 6.8 g/dL (ref 6.0–8.5)
eGFR: 69 mL/min/{1.73_m2} (ref >59)

## 2022-02-20 LAB — LIPID PANEL
Chol/HDL Ratio: 3.1 ratio (ref 0.0–5.0)
Cholesterol, Total: 150 mg/dL (ref 100–199)
HDL: 49 mg/dL (ref >39)
LDL Chol Calc (NIH): 91 mg/dL (ref 0–99)
Triglycerides: 47 mg/dL (ref 0–149)
VLDL Cholesterol Cal: 10 mg/dL (ref 5–40)

## 2022-02-20 LAB — CBC WITH DIFFERENTIAL/PLATELET
Basophils Absolute: 0 10*3/uL (ref 0.0–0.2)
Basos: 0 %
EOS (ABSOLUTE): 0.2 10*3/uL (ref 0.0–0.4)
Eos: 2 %
Hematocrit: 50.5 % (ref 37.5–51.0)
Hemoglobin: 16.5 g/dL (ref 13.0–17.7)
Immature Grans (Abs): 0 10*3/uL (ref 0.0–0.1)
Immature Granulocytes: 0 %
Lymphocytes Absolute: 2.5 10*3/uL (ref 0.7–3.1)
Lymphs: 28 %
MCH: 29.7 pg (ref 26.6–33.0)
MCHC: 32.7 g/dL (ref 31.5–35.7)
MCV: 91 fL (ref 79–97)
Monocytes Absolute: 0.6 10*3/uL (ref 0.1–0.9)
Monocytes: 6 %
Neutrophils Absolute: 5.6 10*3/uL (ref 1.4–7.0)
Neutrophils: 64 %
Platelets: 200 10*3/uL (ref 150–450)
RBC: 5.56 x10E6/uL (ref 4.14–5.80)
RDW: 12.2 % (ref 11.6–15.4)
WBC: 8.9 10*3/uL (ref 3.4–10.8)

## 2022-02-20 LAB — HEPATITIS C ANTIBODY: Hep C Virus Ab: NONREACTIVE

## 2022-02-20 LAB — CARDIOVASCULAR RISK ASSESSMENT

## 2022-02-20 NOTE — Progress Notes (Signed)
Glucose 107, kidney tests normal, liver tests normal, Hepatitis c non reactive, Lipids, normal, CBC normal,  lp

## 2022-04-19 DIAGNOSIS — G500 Trigeminal neuralgia: Secondary | ICD-10-CM | POA: Diagnosis not present

## 2022-04-19 DIAGNOSIS — J342 Deviated nasal septum: Secondary | ICD-10-CM | POA: Diagnosis not present

## 2022-04-19 DIAGNOSIS — G44009 Cluster headache syndrome, unspecified, not intractable: Secondary | ICD-10-CM | POA: Diagnosis not present

## 2022-04-19 DIAGNOSIS — J343 Hypertrophy of nasal turbinates: Secondary | ICD-10-CM | POA: Diagnosis not present

## 2022-07-08 ENCOUNTER — Other Ambulatory Visit: Payer: Self-pay | Admitting: Legal Medicine

## 2022-07-08 DIAGNOSIS — I10 Essential (primary) hypertension: Secondary | ICD-10-CM

## 2022-07-26 ENCOUNTER — Other Ambulatory Visit: Payer: Self-pay | Admitting: Legal Medicine

## 2022-07-26 DIAGNOSIS — E782 Mixed hyperlipidemia: Secondary | ICD-10-CM

## 2022-08-27 ENCOUNTER — Ambulatory Visit: Payer: BC Managed Care – PPO | Admitting: Legal Medicine

## 2022-09-20 DIAGNOSIS — I10 Essential (primary) hypertension: Secondary | ICD-10-CM | POA: Diagnosis not present

## 2022-09-20 DIAGNOSIS — Z1331 Encounter for screening for depression: Secondary | ICD-10-CM | POA: Diagnosis not present

## 2022-09-20 DIAGNOSIS — Z720 Tobacco use: Secondary | ICD-10-CM | POA: Diagnosis not present

## 2022-09-20 DIAGNOSIS — G500 Trigeminal neuralgia: Secondary | ICD-10-CM | POA: Diagnosis not present

## 2022-12-24 DIAGNOSIS — Z125 Encounter for screening for malignant neoplasm of prostate: Secondary | ICD-10-CM | POA: Diagnosis not present

## 2022-12-24 DIAGNOSIS — Z23 Encounter for immunization: Secondary | ICD-10-CM | POA: Diagnosis not present

## 2022-12-24 DIAGNOSIS — G44009 Cluster headache syndrome, unspecified, not intractable: Secondary | ICD-10-CM | POA: Diagnosis not present

## 2022-12-24 DIAGNOSIS — I10 Essential (primary) hypertension: Secondary | ICD-10-CM | POA: Diagnosis not present

## 2022-12-24 DIAGNOSIS — Z0000 Encounter for general adult medical examination without abnormal findings: Secondary | ICD-10-CM | POA: Diagnosis not present

## 2022-12-24 DIAGNOSIS — Z79899 Other long term (current) drug therapy: Secondary | ICD-10-CM | POA: Diagnosis not present

## 2022-12-24 DIAGNOSIS — Z131 Encounter for screening for diabetes mellitus: Secondary | ICD-10-CM | POA: Diagnosis not present

## 2022-12-24 DIAGNOSIS — E785 Hyperlipidemia, unspecified: Secondary | ICD-10-CM | POA: Diagnosis not present

## 2023-03-26 DIAGNOSIS — I10 Essential (primary) hypertension: Secondary | ICD-10-CM | POA: Diagnosis not present

## 2023-03-26 DIAGNOSIS — R972 Elevated prostate specific antigen [PSA]: Secondary | ICD-10-CM | POA: Diagnosis not present

## 2023-03-26 DIAGNOSIS — E1169 Type 2 diabetes mellitus with other specified complication: Secondary | ICD-10-CM | POA: Diagnosis not present

## 2023-05-28 ENCOUNTER — Other Ambulatory Visit: Payer: Self-pay

## 2023-06-10 ENCOUNTER — Other Ambulatory Visit: Payer: Self-pay

## 2023-07-04 DIAGNOSIS — I10 Essential (primary) hypertension: Secondary | ICD-10-CM | POA: Diagnosis not present

## 2023-07-04 DIAGNOSIS — E1169 Type 2 diabetes mellitus with other specified complication: Secondary | ICD-10-CM | POA: Diagnosis not present

## 2023-07-04 DIAGNOSIS — E785 Hyperlipidemia, unspecified: Secondary | ICD-10-CM | POA: Diagnosis not present

## 2023-07-04 DIAGNOSIS — R972 Elevated prostate specific antigen [PSA]: Secondary | ICD-10-CM | POA: Diagnosis not present

## 2023-09-30 DIAGNOSIS — G44009 Cluster headache syndrome, unspecified, not intractable: Secondary | ICD-10-CM | POA: Diagnosis not present

## 2023-09-30 DIAGNOSIS — R519 Headache, unspecified: Secondary | ICD-10-CM | POA: Diagnosis not present

## 2024-01-09 DIAGNOSIS — I10 Essential (primary) hypertension: Secondary | ICD-10-CM | POA: Diagnosis not present

## 2024-01-09 DIAGNOSIS — Z1331 Encounter for screening for depression: Secondary | ICD-10-CM | POA: Diagnosis not present

## 2024-01-09 DIAGNOSIS — E785 Hyperlipidemia, unspecified: Secondary | ICD-10-CM | POA: Diagnosis not present

## 2024-01-09 DIAGNOSIS — E1169 Type 2 diabetes mellitus with other specified complication: Secondary | ICD-10-CM | POA: Diagnosis not present

## 2024-01-09 DIAGNOSIS — Z23 Encounter for immunization: Secondary | ICD-10-CM | POA: Diagnosis not present

## 2024-01-09 DIAGNOSIS — R972 Elevated prostate specific antigen [PSA]: Secondary | ICD-10-CM | POA: Diagnosis not present

## 2024-02-13 ENCOUNTER — Encounter: Payer: Self-pay | Admitting: Family Medicine

## 2024-03-10 ENCOUNTER — Encounter: Payer: Self-pay | Admitting: Gastroenterology

## 2024-05-18 ENCOUNTER — Ambulatory Visit (AMBULATORY_SURGERY_CENTER)

## 2024-05-18 ENCOUNTER — Encounter

## 2024-05-18 VITALS — Ht 72.0 in | Wt 195.0 lb

## 2024-05-18 DIAGNOSIS — Z860100 Personal history of colon polyps, unspecified: Secondary | ICD-10-CM

## 2024-05-18 MED ORDER — NA SULFATE-K SULFATE-MG SULF 17.5-3.13-1.6 GM/177ML PO SOLN: 1.0000 | Freq: Once | ORAL | 0 refills | Status: AC

## 2024-05-18 NOTE — Progress Notes (Unsigned)

## 2024-05-18 NOTE — Progress Notes (Unsigned)
 Pt's name and DOB verified at the beginning of the pre-visit with 2 identifiers  Pt denies any difficulty with ambulating,sitting, laying down or rolling side to side  Pt has no issues moving head neck or swallowing  No egg or soy allergy known to patient   No issues known to pt with past sedation  No FH of Malignant Hyperthermia  Pt is not on home 02   Pt is not on blood thinners   Pt denies issues with constipation   Pt has frequent issues with constipation RN instructed pt to use Miralax per bottles instructions a week before prep days. Pt states they will  Pt is not on dialysis  Pt denise any abnormal heart rhythms   Pt denies any upcoming cardiac testing  Patient's chart reviewed by Norleen Schillings CNRA prior to pre-visit and patient appropriate for the [facility].  Pre-visit completed and red dot placed by patient's name on their procedure day (on provider's schedule).    Visit by phone  Pt states weight is  Pt given  both [facility] main # and MD on call # prior to instructions.  Informed pt to come in at the time discussed and is shown on PV instructions.  Pt instructed to use Singlecare.com or GoodRx for a price reduction on prep  Instructed pt to review instructions again prior to procedure and call main # given if has any questions or any issues. Pt states they will. Instructed pt where to find PV instructions in My Chart  Instructed pt on all aspects of written instructions including med holds clothing to wear and foods to eat and not eat as well as after procedure legal restrictions and to call MD on call if needed.. Pt states understanding.

## 2024-05-19 ENCOUNTER — Encounter: Payer: Self-pay | Admitting: Gastroenterology

## 2024-05-28 ENCOUNTER — Encounter: Payer: Self-pay | Admitting: Gastroenterology

## 2024-05-28 ENCOUNTER — Ambulatory Visit: Admitting: Gastroenterology

## 2024-05-28 VITALS — BP 128/83 | HR 64 | Temp 98.9°F | Resp 11 | Ht 72.0 in | Wt 195.0 lb

## 2024-05-28 DIAGNOSIS — Z1211 Encounter for screening for malignant neoplasm of colon: Secondary | ICD-10-CM

## 2024-05-28 DIAGNOSIS — K621 Rectal polyp: Secondary | ICD-10-CM | POA: Diagnosis not present

## 2024-05-28 DIAGNOSIS — Z860100 Personal history of colon polyps, unspecified: Secondary | ICD-10-CM

## 2024-05-28 DIAGNOSIS — D128 Benign neoplasm of rectum: Secondary | ICD-10-CM

## 2024-05-28 DIAGNOSIS — K5730 Diverticulosis of large intestine without perforation or abscess without bleeding: Secondary | ICD-10-CM | POA: Diagnosis not present

## 2024-05-28 DIAGNOSIS — K640 First degree hemorrhoids: Secondary | ICD-10-CM | POA: Diagnosis not present

## 2024-05-28 MED ORDER — SODIUM CHLORIDE 0.9 % IV SOLN: 500.0000 mL | Freq: Once | INTRAVENOUS | Status: DC

## 2024-05-28 NOTE — Progress Notes (Signed)
Vitals-SS  Pt's states no medical or surgical changes since previsit or office visit.  

## 2024-05-28 NOTE — Progress Notes (Signed)
 [facility] History and Physical   Primary Care Physician:  Stephanie Charlene CROME, MD   Reason for Procedure:     CRC screening  Plan:     colonoscopy     HPI: Jeff Acevedo is a 63 y.o. male    History reviewed. No pertinent past medical history.  Past Surgical History:  Procedure Laterality Date   CARPAL TUNNEL RELEASE     COLONOSCOPY      Prior to Admission medications   Medication Sig Start Date End Date Taking? Authorizing Provider  captopril  (CAPOTEN ) 100 MG tablet Take 1 tablet (100 mg total) by mouth daily. 08/16/21  Yes Abran Jerilynn Loving, MD  hydrochlorothiazide  (HYDRODIURIL ) 25 MG tablet TAKE 1 TABLET BY MOUTH EVERY DAY IN THE MORNING 08/16/21  Yes Abran Jerilynn Loving, MD  rosuvastatin  (CRESTOR ) 40 MG tablet TAKE 1 TABLET BY MOUTH EVERY DAY 07/26/22  Yes Abran Jerilynn Loving, MD  verapamil  (CALAN -SR) 120 MG CR tablet TAKE 1 TABLET BY MOUTH AT BEDTIME. 07/08/22  Yes Abran Jerilynn Loving, MD  Ibuprofen  200 MG CAPS Take 2 capsules (400 mg total) by mouth every 8 (eight) hours as needed (headache/fever). Patient not taking: Reported on 05/18/2024 01/05/16   Fairy Frames, MD  Rimegepant Sulfate (NURTEC) 75 MG TBDP Take 1 tablet by mouth once as needed for up to 1 dose (headache). Patient not taking: Reported on 05/18/2024 05/10/21   Abran Jerilynn Loving, MD    Current Outpatient Medications  Medication Sig Dispense Refill   captopril  (CAPOTEN ) 100 MG tablet Take 1 tablet (100 mg total) by mouth daily. 90 tablet 2   hydrochlorothiazide  (HYDRODIURIL ) 25 MG tablet TAKE 1 TABLET BY MOUTH EVERY DAY IN THE MORNING 90 tablet 2   rosuvastatin  (CRESTOR ) 40 MG tablet TAKE 1 TABLET BY MOUTH EVERY DAY 90 tablet 2   verapamil  (CALAN -SR) 120 MG CR tablet TAKE 1 TABLET BY MOUTH AT BEDTIME. 90 tablet 2   Ibuprofen  200 MG CAPS Take 2 capsules (400 mg total) by mouth every 8 (eight) hours as needed (headache/fever). (Patient not taking: Reported on 05/18/2024)  0    Rimegepant Sulfate (NURTEC) 75 MG TBDP Take 1 tablet by mouth once as needed for up to 1 dose (headache). (Patient not taking: Reported on 05/18/2024) 10 tablet 3   No current facility-administered medications for this visit.    Allergies as of 05/28/2024   (No Known Allergies)    Family History  Problem Relation Age of Onset   Heart failure Father    Heart failure Other    Stomach cancer Neg Hx    Rectal cancer Neg Hx    Esophageal cancer Neg Hx    Colon cancer Neg Hx    Colon polyps Neg Hx     Social History   Socioeconomic History   Marital status: Married    Spouse name: Not on file   Number of children: 2   Years of education: Not on file   Highest education level: Not on file  Occupational History   Occupation: lift & build furnitu  Tobacco Use   Smoking status: Every Day    Current packs/day: 0.30    Average packs/day: 0.3 packs/day for 21.0 years (6.3 ttl pk-yrs)    Types: Cigarettes   Smokeless tobacco: Never  Vaping Use   Vaping status: Never Used  Substance and Sexual Activity   Alcohol use: No   Drug use: No   Sexual activity: Yes    Partners: Female  Other Topics  Concern   Not on file  Social History Narrative   Lives with wife in one story home      Highest level of education is highschool         Left handed; Caffeine - 1 cup coffee am/ 2 sodas a day;      Exercise - walk at work   Left handed      Social Drivers of Health   Financial Resource Strain: Not on file  Food Insecurity: Not on file  Transportation Needs: Not on file  Physical Activity: Not on file  Stress: Not on file  Social Connections: Not on file  Intimate Partner Violence: Not on file    Review of Systems: Positive for none All other review of systems negative except as mentioned in the HPI.  Physical Exam: Vital signs in last 24 hours: @VSRANGES @   General:   Alert,  Well-developed, well-nourished, pleasant and cooperative in NAD Lungs:  Clear throughout to  auscultation.   Heart:  Regular rate and rhythm; no murmurs, clicks, rubs,  or gallops. Abdomen:  Soft, nontender and nondistended. Normal bowel sounds.   Neuro/Psych:  Alert and cooperative. Normal mood and affect. A and O x 3    No significant changes were identified.  The patient continues to be an appropriate candidate for the planned procedure and anesthesia.   Anselm Bring, MD. [facility] 05/28/2024 11:55 AM@

## 2024-05-28 NOTE — Progress Notes (Signed)
 To pacu, VSS. Report to RN.tb

## 2024-05-28 NOTE — Patient Instructions (Addendum)
 Thank you for letting us  care for your healthcare needs today! Please see handouts regarding Polyps, Diverticulosis and Hemorrhoids. Also see handout regarding High Fiber Diet. Continue present medications. Await pathology results.   YOU HAD AN ENDOSCOPIC PROCEDURE TODAY AT THE [facility]:   Refer to the procedure report that was given to you for any specific questions about what was found during the examination.  If the procedure report does not answer your questions, please call your gastroenterologist to clarify.  If you requested that your care partner not be given the details of your procedure findings, then the procedure report has been included in a sealed envelope for you to review at your convenience later.  YOU SHOULD EXPECT: Some feelings of bloating in the abdomen. Passage of more gas than usual.  Walking can help get rid of the air that was put into your GI tract during the procedure and reduce the bloating. If you had a lower endoscopy (such as a colonoscopy or flexible sigmoidoscopy) you may notice spotting of blood in your stool or on the toilet paper. If you underwent a bowel prep for your procedure, you may not have a normal bowel movement for a few days.  Please Note:  You might notice some irritation and congestion in your nose or some drainage.  This is from the oxygen used during your procedure.  There is no need for concern and it should clear up in a day or so.  SYMPTOMS TO REPORT IMMEDIATELY:  Following lower endoscopy (colonoscopy or flexible sigmoidoscopy):  Excessive amounts of blood in the stool  Significant tenderness or worsening of abdominal pains  Swelling of the abdomen that is new, acute  Fever of 100F or higher  For urgent or emergent issues, a gastroenterologist can be reached at any hour by calling [phone]. Do not use MyChart messaging for urgent concerns.    DIET:  We do recommend a small meal at first, but then you may proceed to your  regular diet.  Drink plenty of fluids but you should avoid alcoholic beverages for 24 hours.  ACTIVITY:  You should plan to take it easy for the rest of today and you should NOT DRIVE or use heavy machinery until tomorrow (because of the sedation medicines used during the test).    FOLLOW UP: Our staff will call the number listed on your records the next business day following your procedure.  We will call around 7:15- 8:00 am to check on you and address any questions or concerns that you may have regarding the information given to you following your procedure. If we do not reach you, we will leave a message.     If any biopsies were taken you will be contacted by phone or by letter within the next 1-3 weeks.  Please call us  at [phone] if you have not heard about the biopsies in 3 weeks.    SIGNATURES/CONFIDENTIALITY: You and/or your care partner have signed paperwork which will be entered into your electronic medical record.  These signatures attest to the fact that that the information above on your After Visit Summary has been reviewed and is understood.  Full responsibility of the confidentiality of this discharge information lies with you and/or your care-partner.

## 2024-05-28 NOTE — Progress Notes (Signed)
 Called to room to assist during endoscopic procedure.  Patient ID and intended procedure confirmed with present staff. Received instructions for my participation in the procedure from the performing physician.

## 2024-05-28 NOTE — Op Note (Signed)
 [facility] Patient Name: Jeff Acevedo Procedure Date: 05/28/2024 9:40 AM MRN: [id number] Endoscopist: Lynnie Bring , MD, [id number] Age: 63 Referring MD:  Date of Birth: [birth year] Gender: Male Account #: [id number] Procedure:                Colonoscopy Indications:              Screening for colorectal malignant neoplasm Medicines:                Monitored Anesthesia Care Procedure:                Pre-Anesthesia Assessment:                           - Prior to the procedure, a History and Physical                            was performed, and patient medications and                            allergies were reviewed. The patient's tolerance of                            previous anesthesia was also reviewed. The risks                            and benefits of the procedure and the sedation                            options and risks were discussed with the patient.                            All questions were answered, and informed consent                            was obtained. Prior Anticoagulants: The patient has                            taken no anticoagulant or antiplatelet agents. ASA                            Grade Assessment: II - A patient with mild systemic                            disease. After reviewing the risks and benefits,                            the patient was deemed in satisfactory condition to                            undergo the procedure.                           After obtaining informed consent, the colonoscope  was passed under direct vision. Throughout the                            procedure, the patient's blood pressure, pulse, and                            oxygen saturations were monitored continuously. The                            Olympus Scope SN [id number] was introduced through the                            anus and advanced to the the cecum, identified by                            appendiceal  orifice and ileocecal valve. The                            colonoscopy was performed without difficulty. The                            patient tolerated the procedure well. The quality                            of the bowel preparation was good. The ileocecal                            valve, appendiceal orifice, and rectum were                            photographed. Scope In: 9:58:09 AM Scope Out: 10:06:57 AM Scope Withdrawal Time: 0 hours 6 minutes 56 seconds  Total Procedure Duration: 0 hours 8 minutes 48 seconds  Findings:                 A 6 mm polyp was found in the distal rectum. The                            polyp was sessile. The polyp was removed with a                            cold snare. Resection and retrieval were complete.                           A few small-mouthed diverticula were found in the                            sigmoid colon.                           Non-bleeding internal hemorrhoids were found during                            retroflexion and during perianal exam. The  hemorrhoids were small and Grade I (internal                            hemorrhoids that do not prolapse).                           Retroflexion in the right colon was performed.                           The exam was otherwise without abnormality on                            direct and retroflexion views. Complications:            No immediate complications. Estimated Blood Loss:     Estimated blood loss: none. Impression:               - One 6 mm polyp in the distal rectum, removed with                            a cold snare. Resected and retrieved.                           - Mild sigmoid diverticulosis.                           - Non-bleeding internal hemorrhoids.                           - The examination was otherwise normal on direct                            and retroflexion views. Recommendation:           - Patient has a contact number  available for                            emergencies. The signs and symptoms of potential                            delayed complications were discussed with the                            patient. Return to normal activities tomorrow.                            Written discharge instructions were provided to the                            patient.                           - High fiber diet.                           - Continue present medications.                           -  Await pathology results.                           - Repeat colonoscopy for surveillance based on                            pathology results.                           - The findings and recommendations were discussed                            with the patient's family. Lynnie Bring, MD 05/28/2024 10:10:16 AM This report has been signed electronically.

## 2024-05-31 ENCOUNTER — Telehealth: Payer: Self-pay

## 2024-05-31 NOTE — Telephone Encounter (Signed)
 Left message on follow up call.

## 2024-06-02 LAB — SURGICAL PATHOLOGY

## 2024-06-06 ENCOUNTER — Ambulatory Visit: Payer: Self-pay | Admitting: Gastroenterology

## 2024-07-15 DIAGNOSIS — E1169 Type 2 diabetes mellitus with other specified complication: Secondary | ICD-10-CM | POA: Diagnosis not present

## 2024-07-15 DIAGNOSIS — R519 Headache, unspecified: Secondary | ICD-10-CM | POA: Diagnosis not present

## 2024-07-15 DIAGNOSIS — E785 Hyperlipidemia, unspecified: Secondary | ICD-10-CM | POA: Diagnosis not present

## 2024-07-15 DIAGNOSIS — I10 Essential (primary) hypertension: Secondary | ICD-10-CM | POA: Diagnosis not present

## 2024-12-20 ENCOUNTER — Ambulatory Visit: Admitting: Diagnostic Neuroimaging
